# Patient Record
Sex: Female | Born: 1950
Health system: Southern US, Community
[De-identification: ages and names within clinical notes are randomized; demographics above are authoritative.]

## PROBLEM LIST (undated history)

## (undated) DIAGNOSIS — M1990 Unspecified osteoarthritis, unspecified site: Secondary | ICD-10-CM

## (undated) DIAGNOSIS — F32A Depression, unspecified: Secondary | ICD-10-CM

## (undated) DIAGNOSIS — I10 Essential (primary) hypertension: Secondary | ICD-10-CM

## (undated) DIAGNOSIS — T8859XA Other complications of anesthesia, initial encounter: Secondary | ICD-10-CM

## (undated) DIAGNOSIS — K219 Gastro-esophageal reflux disease without esophagitis: Secondary | ICD-10-CM

## (undated) DIAGNOSIS — F329 Major depressive disorder, single episode, unspecified: Secondary | ICD-10-CM

## (undated) DIAGNOSIS — T4145XA Adverse effect of unspecified anesthetic, initial encounter: Secondary | ICD-10-CM

## (undated) DIAGNOSIS — F419 Anxiety disorder, unspecified: Secondary | ICD-10-CM

## (undated) DIAGNOSIS — D241 Benign neoplasm of right breast: Secondary | ICD-10-CM

## (undated) HISTORY — PX: CHOLECYSTECTOMY: SHX55

## (undated) HISTORY — PX: ABDOMINAL HYSTERECTOMY: SHX81

## (undated) HISTORY — PX: APPENDECTOMY: SHX54

---

## 2000-01-07 ENCOUNTER — Encounter: Admission: RE | Admit: 2000-01-07 | Discharge: 2000-02-03 | Payer: Self-pay | Admitting: Neurological Surgery

## 2003-10-15 ENCOUNTER — Ambulatory Visit (HOSPITAL_COMMUNITY): Admission: RE | Admit: 2003-10-15 | Discharge: 2003-10-15 | Payer: Self-pay | Admitting: Cardiology

## 2004-06-01 ENCOUNTER — Ambulatory Visit (HOSPITAL_COMMUNITY): Admission: RE | Admit: 2004-06-01 | Discharge: 2004-06-01 | Payer: Self-pay | Admitting: Obstetrics and Gynecology

## 2004-06-01 ENCOUNTER — Encounter (INDEPENDENT_AMBULATORY_CARE_PROVIDER_SITE_OTHER): Payer: Self-pay | Admitting: Specialist

## 2004-12-18 ENCOUNTER — Encounter (INDEPENDENT_AMBULATORY_CARE_PROVIDER_SITE_OTHER): Payer: Self-pay | Admitting: Specialist

## 2004-12-18 ENCOUNTER — Inpatient Hospital Stay (HOSPITAL_COMMUNITY): Admission: EM | Admit: 2004-12-18 | Discharge: 2004-12-19 | Payer: Self-pay | Admitting: Emergency Medicine

## 2006-02-08 ENCOUNTER — Ambulatory Visit: Payer: Self-pay | Admitting: Family Medicine

## 2007-10-30 ENCOUNTER — Emergency Department (HOSPITAL_COMMUNITY): Admission: EM | Admit: 2007-10-30 | Discharge: 2007-10-30 | Payer: Self-pay | Admitting: Emergency Medicine

## 2007-11-01 ENCOUNTER — Emergency Department (HOSPITAL_COMMUNITY): Admission: EM | Admit: 2007-11-01 | Discharge: 2007-11-01 | Payer: Self-pay | Admitting: Emergency Medicine

## 2009-02-25 ENCOUNTER — Ambulatory Visit: Payer: Self-pay | Admitting: Family Medicine

## 2009-02-25 ENCOUNTER — Observation Stay (HOSPITAL_COMMUNITY): Admission: EM | Admit: 2009-02-25 | Discharge: 2009-02-27 | Payer: Self-pay | Admitting: Emergency Medicine

## 2009-10-20 ENCOUNTER — Ambulatory Visit: Payer: Self-pay | Admitting: Gastroenterology

## 2010-02-16 ENCOUNTER — Ambulatory Visit: Payer: Self-pay | Admitting: Surgery

## 2010-03-31 ENCOUNTER — Ambulatory Visit: Payer: Self-pay | Admitting: Surgery

## 2010-04-03 LAB — PATHOLOGY REPORT

## 2010-08-13 LAB — POCT I-STAT, CHEM 8
BUN: 16 mg/dL (ref 6–23)
Calcium, Ion: 1.06 mmol/L — ABNORMAL LOW (ref 1.12–1.32)
Chloride: 106 mEq/L (ref 96–112)
Glucose, Bld: 102 mg/dL — ABNORMAL HIGH (ref 70–99)
Potassium: 3.8 mEq/L (ref 3.5–5.1)
Sodium: 139 mEq/L (ref 135–145)
TCO2: 22 mmol/L (ref 0–100)

## 2010-08-13 LAB — CK TOTAL AND CKMB (NOT AT ARMC): Relative Index: INVALID (ref 0.0–2.5)

## 2010-08-13 LAB — CARDIAC PANEL(CRET KIN+CKTOT+MB+TROPI)
CK, MB: 0.8 ng/mL (ref 0.3–4.0)
Relative Index: INVALID (ref 0.0–2.5)
Total CK: 43 U/L (ref 7–177)
Troponin I: 0.03 ng/mL (ref 0.00–0.06)
Troponin I: 0.06 ng/mL (ref 0.00–0.06)

## 2010-08-13 LAB — CBC
HCT: 34.9 % — ABNORMAL LOW (ref 36.0–46.0)
Hemoglobin: 12.1 g/dL (ref 12.0–15.0)
MCHC: 34.8 g/dL (ref 30.0–36.0)
RBC: 3.83 MIL/uL — ABNORMAL LOW (ref 3.87–5.11)

## 2010-08-13 LAB — LIPID PANEL
Cholesterol: 247 mg/dL — ABNORMAL HIGH (ref 0–200)
Total CHOL/HDL Ratio: 4.1 RATIO
Triglycerides: 565 mg/dL — ABNORMAL HIGH (ref <150)
VLDL: UNDETERMINED mg/dL (ref 0–40)

## 2010-08-13 LAB — BASIC METABOLIC PANEL
Creatinine, Ser: 0.99 mg/dL (ref 0.4–1.2)
GFR calc non Af Amer: 57 mL/min — ABNORMAL LOW (ref >60)
Potassium: 3.8 mEq/L (ref 3.5–5.1)

## 2010-08-13 LAB — POCT CARDIAC MARKERS: Myoglobin, poc: 42.3 ng/mL (ref 12–200)

## 2010-09-25 NOTE — Op Note (Signed)
[name], [name]                   ACCOUNT NO.:  [id number]   MEDICAL RECORD NO.:  [id number]          PATIENT TYPE:  AMB   LOCATION:  SDC                           FACILITY:  [facility]   PHYSICIAN:  Michelle L. Grewal, M.D.DATE OF BIRTH:  1951-04-26   DATE OF PROCEDURE:  06/01/2004  DATE OF DISCHARGE:                                 OPERATIVE REPORT   PREOPERATIVE DIAGNOSIS:  Postmenopausal bleeding and endometrial polyp.   POSTOPERATIVE DIAGNOSIS:  Postmenopausal bleeding and endometrial polyp.   PROCEDURE:  Dilatation and curettage and hysteroscopy.   ANESTHESIA:  MAC with local.   SPECIMENS:  Endometrial polyp.   ESTIMATED BLOOD LOSS:  50 cubic centimeters.   COMPLICATIONS:  None.   DESCRIPTION OF PROCEDURE:  The patient was taken to the operating room.  She  was given sedation.  She was placed in the lithotomy position and prepped  and draped in the usual sterile fashion.  Speculum was placed in the vagina.  The cervix was grasped with a tenaculum and a paracervical block was  performed in the standard fashion.  The uterus was sounded to 8 cm and noted  to be in the midline.  Cervical internal os was gently dilated using Pratt  dilators.  The diagnostic hysteroscope was inserted into the uterine cavity  and, with good visualization, a small endometrial polyp was visualized  coming from the left wall of the uterus.  Otherwise, the endometrium  appeared very atrophic.  The hysteroscope was removed and a sharp curet was  removed, and a thorough uterine curettage was performed and, using polyp  forceps, the endometrial polyp was retrieved.  A final sharp curettage was  performed.  All tissue was sent to pathology.  There was no bleeding noted  at the end of the procedure.  The patient tolerated the procedure well and  all sponge, lap, and instrument counts were correct x 2.      MLG/MEDQ  D:  06/01/2004  T:  06/01/2004  Job:  [id number]

## 2010-09-25 NOTE — Cardiovascular Report (Signed)
NAMESHAVONN, CONVEY                             ACCOUNT NO.:  [id number]   MEDICAL RECORD NO.:  [id number]                   PATIENT TYPE:  OIB   LOCATION:  2899                                 FACILITY:  [facility]   PHYSICIAN:  Aram Candela. Tysinger, M.D.              DATE OF BIRTH:  [birth year]   DATE OF PROCEDURE:  10/15/2003  DATE OF DISCHARGE:  10/15/2003                              CARDIAC CATHETERIZATION   PROCEDURES:  1. Left heart catheterization.  2. Coronary cineangiography.  3. Left ventricular cineangiography.  4. Angio-Seal to the right femoral artery.   INDICATION FOR PROCEDURES:  This 60 year old female had the recent onset of  anterior chest pain and stress test done on May 5 was positive with  significant ST segment depression with evidence for myocardial ischemia and  a strong family history of ischemic heart disease.  She was scheduled for  delineation of her cardiac status.   PROCEDURE:  After signing an informed consent, the patient was premedicated  with 50 mg of Benadryl intravenously and brought to the cardiac  catheterization lab.  Her right groin was prepped and draped in sterile  fashion and anesthetized locally with 1% lidocaine.  A 6 French introducer  sheath was inserted percutaneously into the right femoral artery.  6 [place]  #4 Judkins coronary catheters were used to make injections into the native  coronary arteries.  A 6 French pigtail catheter was used to measures  pressures in the left ventricle and aorta and to make midstream injection  into the left ventricle.  The patient tolerated the procedure well and no  complications were noted.  At the end of the procedure the catheter and  sheath were removed from the right femoral artery and hemostasis was easily  obtained with an Angio-Seal closure system.   MEDICATIONS GIVEN:  None.   CINE FINDINGS:   CORONARY CINEANGIOGRAPHY:  1. Left coronary artery:  The ostium and left main appear normal.  2. Left  anterior descending appears normal.  3. Circumflex coronary artery appears normal.  4. Right coronary artery appears normal.   LEFT VENTRICULAR CINEANGIOGRAM:  The left ventricular chamber size and  contractility appear normal.  There are no abnormally moving segments.  The  ejection fraction is estimated at 60-70%.  The aortic and mitral valves  appear normal.  There is no evidence for mitral insufficiency, calcification  or prolapse.   FINAL DIAGNOSES:  1. Normal cardiac study.  2. Normal coronary arteries.  3. Normal left ventricular function.  4. Normal aortic and mitral valves.  5. Successful Perclose of the right femoral artery.   DISPOSITION:  Will monitor on short stay unit prior to discharge later  today.  Will follow up in the office in two to three weeks.  John R. Aleen Campi, M.D.    JRT/MEDQ  D:  10/15/2003  T:  10/16/2003  Job:  [id number]

## 2010-09-25 NOTE — H&P (Signed)
[name], [name]                   ACCOUNT NO.:  [id number]   MEDICAL RECORD NO.:  [id number]          PATIENT TYPE:  INP   LOCATION:  0101                         FACILITY:  [facility]   PHYSICIAN:  Anselm Pancoast. Weatherly, M.D.DATE OF BIRTH:  04/30/1951   DATE OF ADMISSION:  12/18/2004  DATE OF DISCHARGE:                                HISTORY & PHYSICAL   CHIEF COMPLAINT:  Abdominal pain 48 hours.   HISTORY:  [name] is a 60 year old female who was referred to [place] by Dr.  Lacie Scotts of the [facility] who has seen Ms. Wickliff yesterday. She  had complained of about 24 hours of right sided abdominal pain that was  increasing and first an ultrasound of the gallbladder was obtained and this  showed no stones. The patient then had a CT with oral and IV contrast that  showed a retrocecal acutely inflamed appendix and she was referred in. The  patient stated she had had no previous episodes of similar pain. She had had  a uterine polyp removed by Dr. Vincente Poli earlier this year and otherwise she is  in good health. The patient is not on any chronic blood pressure or other  chronic medications. Her white count had been 7900 yesterday but she was  definitely tender in the right lower quadrant. I repeated it in the  emergency room today and it was still only about 9200. Previously the  patient has had a GYN procedure through a midline incision I think was a  tubal ligation.   ALLERGIES:  She says that ERYTHROMYCIN causes extreme nausea and vomiting.   MEDICATIONS:  She is on estrogen replacement _________ mg daily. She is on  Prevacid and a coated aspirin daily. Trazodone 1-1/2 tablets hour of sleep  and methylprednisolone but I am not sure what she is on this for.  I think  it is anxiety.   SOCIAL HISTORY:  No history of alcohol and no history of smoking.   She had a chest x-ray in January and she __________.   PHYSICAL EXAMINATION:  GENERAL:  She is a slightly overweight Caucasian  female in no acute distress but definitely tender in the abdomen.  VITAL SIGNS:  Temperature is 97.5, blood pressure is 135/92, pulse 89,  respirations 20.  HEENT:  Negative.  LUNGS:  Clear.  CARDIAC:  Normal sinus rhythm.  ABDOMEN:  Nontender upper abdomen. In the right lower quadrant she is  definitely tender with muscle guarding. I do not appreciate any tenderness  in the left lower quadrant.  EXTREMITIES:  Lower extremities are unremarkable, good peripheral pulses and  negative psoas and obturators.  CNS:  Physiologic.   The CT that she brought in with her from the [facility] clinic showed an  acutely inflamed appendix that appears to be in the retrocecal area. The  patient will be given 3 g of Unasyn and plan for urgent appendectomy.       WJW/MEDQ  D:  12/18/2004  T:  12/18/2004  Job:  [id number]

## 2010-09-25 NOTE — Op Note (Signed)
[name], [name]                   ACCOUNT NO.:  [id number]   MEDICAL RECORD NO.:  [id number]          PATIENT TYPE:  INP   LOCATION:  0101                         FACILITY:  [facility]   PHYSICIAN:  Anselm Pancoast. Weatherly, M.D.DATE OF BIRTH:  [birth year]   DATE OF PROCEDURE:  12/18/2004  DATE OF DISCHARGE:                                 OPERATIVE REPORT   PREOPERATIVE DIAGNOSIS:  Acute appendicitis.   POSTOPERATIVE DIAGNOSIS:  Acute appendicitis.   OPERATION:  Appendectomy.   ANESTHESIA:  General.   SURGEON:  Anselm Pancoast. Zachery Dakins, M.D.   ASSISTANT:  Nurse.   HISTORY:  [name] is a 60 year old female who has had a 48-hour history of  progressive pain.  When her ultrasound was first done per order by Dr.  Lacie Scotts, which was normal.  Then a CT of the abdomen and pelvis was  performed this morning at [facility] that showed an inflamed  appendix in the retrocecal area.  She was referred in.  I repeated a white  count.  It was still only 9200, and she was definitely tender in the right  lower quadrant at the anterior of the iliac crest area.  She was given 3 gm  of Unasyn and I discussed with her about performing an open appendectomy.   The patient was taken to the operating suite.  Induction of general  anesthesia.  Her abdomen was prepped with Betadine surgical scrub and  solution and draped in a sterile manner.  A small incision, McBurney, was  made.  Sharp dissection in the skin and subcutaneous tissue to Scarpa's  fascia. The external oblique was opened in the direction of its fibers, and  the internal oblique was split in directions of its fibers.  The  transversalis was carefully opened, and then the peritoneum was carefully  opened into the peritoneal cavity.  You could feel the markedly inflamed  appendix kind of retrocecal and was able to kind of mobilize the cecum  medially so I could produce the actual peritoneum, so this could be  __________ to kind of flip  the appendix up.  The appendiceal mesentery was  cross-clamped with Kelly's and right angles.  There was a little bleeding.  We were able to get the vessel sutured with 3-0 silk and then divided the  vessels between clamps and ligated with 2-0 Vicryl.  The appendix was  markedly inflamed on the distal half and it was freed up to its base at the  junction of the cecum and then crushed with a hemostat and tied with a 2-0  Vicryl. A purse-string suture of 3-0 silk was placed.  The appendix removed.  The stump inverted.  A couple of Lembert sutures were placed to kind of  reinforce the purse-string.  There was good hemostasis.  We irrigated,  aspirated, watched the area where there had been a little bleeding, none.  Then the cecum is coursing in its normal position.  The peritoneum and  transversalis was closed with a running 2-0  Vicryl, and then the internal  oblique with interrupted 2-0  Vicryl.  External oblique was closed with a  running 2-0 Vicryl, and the Scarpa's fascia  closed with 4-0 Vicryl, 4-0 subcuticular suture, and Benzoin and Steri-  Strips on the skin.  The patient tolerated the procedure nicely.  Sent to  the recovery room in a stable postoperative condition.  She should be able  to start a diet this evening.  Hopefully will be ready for discharge in the  a.m.       WJW/MEDQ  D:  12/18/2004  T:  12/18/2004  Job:  [id number]   cc:   Evelene Croon  [address]  [place]  [place] [address]  Fax: [phone]

## 2011-01-08 ENCOUNTER — Inpatient Hospital Stay (HOSPITAL_COMMUNITY)
Admission: EM | Admit: 2011-01-08 | Discharge: 2011-01-15 | DRG: 871 | Disposition: A | Discharge status: Home Health | Attending: Internal Medicine | Admitting: Internal Medicine

## 2011-01-08 ENCOUNTER — Other Ambulatory Visit: Payer: Self-pay | Admitting: Internal Medicine

## 2011-01-08 ENCOUNTER — Emergency Department (HOSPITAL_COMMUNITY)

## 2011-01-08 ENCOUNTER — Inpatient Hospital Stay (HOSPITAL_COMMUNITY)

## 2011-01-08 DIAGNOSIS — N2589 Other disorders resulting from impaired renal tubular function: Secondary | ICD-10-CM | POA: Diagnosis present

## 2011-01-08 DIAGNOSIS — E876 Hypokalemia: Secondary | ICD-10-CM | POA: Diagnosis present

## 2011-01-08 DIAGNOSIS — N170 Acute kidney failure with tubular necrosis: Secondary | ICD-10-CM | POA: Diagnosis present

## 2011-01-08 DIAGNOSIS — Z79899 Other long term (current) drug therapy: Secondary | ICD-10-CM

## 2011-01-08 DIAGNOSIS — D696 Thrombocytopenia, unspecified: Secondary | ICD-10-CM | POA: Diagnosis present

## 2011-01-08 DIAGNOSIS — E785 Hyperlipidemia, unspecified: Secondary | ICD-10-CM | POA: Diagnosis present

## 2011-01-08 DIAGNOSIS — Z888 Allergy status to other drugs, medicaments and biological substances status: Secondary | ICD-10-CM

## 2011-01-08 DIAGNOSIS — G9341 Metabolic encephalopathy: Secondary | ICD-10-CM | POA: Diagnosis present

## 2011-01-08 DIAGNOSIS — Z803 Family history of malignant neoplasm of breast: Secondary | ICD-10-CM

## 2011-01-08 DIAGNOSIS — L02419 Cutaneous abscess of limb, unspecified: Secondary | ICD-10-CM | POA: Diagnosis present

## 2011-01-08 DIAGNOSIS — J811 Chronic pulmonary edema: Secondary | ICD-10-CM | POA: Diagnosis not present

## 2011-01-08 DIAGNOSIS — I4891 Unspecified atrial fibrillation: Secondary | ICD-10-CM | POA: Diagnosis not present

## 2011-01-08 DIAGNOSIS — I10 Essential (primary) hypertension: Secondary | ICD-10-CM | POA: Diagnosis present

## 2011-01-08 DIAGNOSIS — Z8249 Family history of ischemic heart disease and other diseases of the circulatory system: Secondary | ICD-10-CM

## 2011-01-08 DIAGNOSIS — F341 Dysthymic disorder: Secondary | ICD-10-CM | POA: Diagnosis present

## 2011-01-08 DIAGNOSIS — Z7982 Long term (current) use of aspirin: Secondary | ICD-10-CM

## 2011-01-08 DIAGNOSIS — N898 Other specified noninflammatory disorders of vagina: Secondary | ICD-10-CM | POA: Diagnosis present

## 2011-01-08 DIAGNOSIS — E871 Hypo-osmolality and hyponatremia: Secondary | ICD-10-CM | POA: Diagnosis present

## 2011-01-08 DIAGNOSIS — M51379 Other intervertebral disc degeneration, lumbosacral region without mention of lumbar back pain or lower extremity pain: Secondary | ICD-10-CM | POA: Diagnosis present

## 2011-01-08 DIAGNOSIS — Z881 Allergy status to other antibiotic agents status: Secondary | ICD-10-CM

## 2011-01-08 DIAGNOSIS — M5137 Other intervertebral disc degeneration, lumbosacral region: Secondary | ICD-10-CM | POA: Diagnosis present

## 2011-01-08 DIAGNOSIS — A419 Sepsis, unspecified organism: Secondary | ICD-10-CM | POA: Diagnosis present

## 2011-01-08 DIAGNOSIS — K219 Gastro-esophageal reflux disease without esophagitis: Secondary | ICD-10-CM | POA: Diagnosis present

## 2011-01-08 LAB — DIFFERENTIAL
Basophils Absolute: 0 10*3/uL (ref 0.0–0.1)
Basophils Absolute: 0 10*3/uL (ref 0.0–0.1)
Basophils Relative: 0 % (ref 0–1)
Lymphocytes Relative: 3 % — ABNORMAL LOW (ref 12–46)
Lymphs Abs: 0.3 10*3/uL — ABNORMAL LOW (ref 0.7–4.0)
Monocytes Relative: 2 % — ABNORMAL LOW (ref 3–12)
Monocytes Relative: 2 % — ABNORMAL LOW (ref 3–12)
Neutro Abs: 9.7 10*3/uL — ABNORMAL HIGH (ref 1.7–7.7)
Neutrophils Relative %: 93 % — ABNORMAL HIGH (ref 43–77)

## 2011-01-08 LAB — POCT I-STAT, CHEM 8
BUN: 57 mg/dL — ABNORMAL HIGH (ref 6–23)
Chloride: 103 mEq/L (ref 96–112)
Creatinine, Ser: 5.3 mg/dL — ABNORMAL HIGH (ref 0.50–1.10)
HCT: 38 % (ref 36.0–46.0)
Hemoglobin: 12.9 g/dL (ref 12.0–15.0)
Potassium: 3.4 mEq/L — ABNORMAL LOW (ref 3.5–5.1)
Sodium: 128 mEq/L — ABNORMAL LOW (ref 135–145)

## 2011-01-08 LAB — URINE MICROSCOPIC-ADD ON

## 2011-01-08 LAB — RAPID URINE DRUG SCREEN, HOSP PERFORMED
Amphetamines: POSITIVE — AB
Barbiturates: NOT DETECTED
Benzodiazepines: NOT DETECTED
Opiates: NOT DETECTED
Tetrahydrocannabinol: NOT DETECTED

## 2011-01-08 LAB — CBC
HCT: 35.5 % — ABNORMAL LOW (ref 36.0–46.0)
Hemoglobin: 12.3 g/dL (ref 12.0–15.0)
MCH: 30.4 pg (ref 26.0–34.0)
MCH: 30.4 pg (ref 26.0–34.0)
Platelets: 130 10*3/uL — ABNORMAL LOW (ref 150–400)
Platelets: 139 10*3/uL — ABNORMAL LOW (ref 150–400)
RBC: 3.72 MIL/uL — ABNORMAL LOW (ref 3.87–5.11)
RDW: 13 % (ref 11.5–15.5)
RDW: 13 % (ref 11.5–15.5)

## 2011-01-08 LAB — URINALYSIS, ROUTINE W REFLEX MICROSCOPIC: Specific Gravity, Urine: 1.025 (ref 1.005–1.030)

## 2011-01-08 LAB — CK TOTAL AND CKMB (NOT AT ARMC)
CK, MB: 12.7 ng/mL (ref 0.3–4.0)
Relative Index: 3.9 — ABNORMAL HIGH (ref 0.0–2.5)

## 2011-01-08 LAB — COMPREHENSIVE METABOLIC PANEL
Albumin: 2.1 g/dL — ABNORMAL LOW (ref 3.5–5.2)
Alkaline Phosphatase: 99 U/L (ref 39–117)
BUN: 60 mg/dL — ABNORMAL HIGH (ref 6–23)
Calcium: 7.1 mg/dL — ABNORMAL LOW (ref 8.4–10.5)
Creatinine, Ser: 4.13 mg/dL — ABNORMAL HIGH (ref 0.50–1.10)
Potassium: 3.7 mEq/L (ref 3.5–5.1)
Total Protein: 5.7 g/dL — ABNORMAL LOW (ref 6.0–8.3)

## 2011-01-08 LAB — MRSA PCR SCREENING: MRSA by PCR: NEGATIVE

## 2011-01-08 LAB — MAGNESIUM: Magnesium: 1.7 mg/dL (ref 1.5–2.5)

## 2011-01-08 LAB — LACTIC ACID, PLASMA
Lactic Acid, Venous: 2.1 mmol/L (ref 0.5–2.2)
Lactic Acid, Venous: 1.5 mmol/L (ref 0.5–2.2)

## 2011-01-08 LAB — PROCALCITONIN
Procalcitonin: 67.08 ng/mL
Procalcitonin: 45.93 ng/mL

## 2011-01-08 LAB — HEPATIC FUNCTION PANEL
ALT: 19 U/L (ref 0–35)
Alkaline Phosphatase: 109 U/L (ref 39–117)
Bilirubin, Direct: 0.8 mg/dL — ABNORMAL HIGH (ref 0.0–0.3)

## 2011-01-08 LAB — TECHNOLOGIST SMEAR REVIEW

## 2011-01-08 LAB — TROPONIN I: Troponin I: <0.3 ng/mL (ref <0.30)

## 2011-01-08 LAB — CREATININE, URINE, RANDOM: Creatinine, Urine: 92.75 mg/dL

## 2011-01-09 ENCOUNTER — Inpatient Hospital Stay (HOSPITAL_COMMUNITY)

## 2011-01-09 DIAGNOSIS — R112 Nausea with vomiting, unspecified: Secondary | ICD-10-CM

## 2011-01-09 DIAGNOSIS — R197 Diarrhea, unspecified: Secondary | ICD-10-CM

## 2011-01-09 DIAGNOSIS — L03119 Cellulitis of unspecified part of limb: Secondary | ICD-10-CM

## 2011-01-09 DIAGNOSIS — L02419 Cutaneous abscess of limb, unspecified: Secondary | ICD-10-CM

## 2011-01-09 LAB — COMPREHENSIVE METABOLIC PANEL
ALT: 18 U/L (ref 0–35)
CO2: 12 mEq/L — ABNORMAL LOW (ref 19–32)
Calcium: 6.3 mg/dL — CL (ref 8.4–10.5)
Creatinine, Ser: 4.65 mg/dL — ABNORMAL HIGH (ref 0.50–1.10)
GFR calc Af Amer: 12 mL/min — ABNORMAL LOW (ref >60)
GFR calc non Af Amer: 10 mL/min — ABNORMAL LOW (ref >60)
Glucose, Bld: 90 mg/dL (ref 70–99)
Sodium: 132 mEq/L — ABNORMAL LOW (ref 135–145)
Total Protein: 5.4 g/dL — ABNORMAL LOW (ref 6.0–8.3)

## 2011-01-09 LAB — CBC
HCT: 30.8 % — ABNORMAL LOW (ref 36.0–46.0)
Hemoglobin: 10.7 g/dL — ABNORMAL LOW (ref 12.0–15.0)
MCH: 30.6 pg (ref 26.0–34.0)
MCHC: 34.7 g/dL (ref 30.0–36.0)
RDW: 13.4 % (ref 11.5–15.5)

## 2011-01-09 LAB — DIFFERENTIAL
Basophils Absolute: 0 10*3/uL (ref 0.0–0.1)
Basophils Relative: 0 % (ref 0–1)
Eosinophils Relative: 4 % (ref 0–5)
Monocytes Absolute: 0.4 10*3/uL (ref 0.1–1.0)
Monocytes Relative: 5 % (ref 3–12)

## 2011-01-09 LAB — CARDIAC PANEL(CRET KIN+CKTOT+MB+TROPI)
CK, MB: 8.6 ng/mL (ref 0.3–4.0)
Total CK: 162 U/L (ref 7–177)
Troponin I: <0.3 ng/mL (ref <0.30)

## 2011-01-09 LAB — URINE CULTURE: Colony Count: >=100000

## 2011-01-09 LAB — PROCALCITONIN: Procalcitonin: 25.38 ng/mL

## 2011-01-09 LAB — HEPARIN LEVEL (UNFRACTIONATED): Heparin Unfractionated: <0.1 IU/mL — ABNORMAL LOW (ref 0.30–0.70)

## 2011-01-09 NOTE — Consult Note (Signed)
[name] NO.:  [id number]  MEDICAL RECORD NO.:  [id number]  LOCATION:  2601                         FACILITY:  [facility]  PHYSICIAN:  Maree Krabbe, M.D.DATE OF BIRTH:  Nov 30, 1950  DATE OF CONSULTATION:  01/09/2011 DATE OF DISCHARGE:                                CONSULTATION   REQUESTING PHYSICIAN:  Hollice Espy, MD  HISTORY:  This is a 60 year old female with a history of hypertension, hyperlipidemia, and depression who was admitted with a 4-day history of progressive redness and pain in her left thigh.  She had been at a ball game 4 days prior to admission and was felt to possibly have an insect bite that started the infection.  She then developed profuse nausea,  vomiting, and diarrhea and was admitted on August 31.  In the emergency room, her creatinine was found to be 5.3.  She was diagnosed with a left thigh cellulitis.  A CT scan showed some superficial fluid collections but no abscess formation.  She has been treated with vancomycin and Zosyn, and clindamycin was added for possible strep toxin effects.  Creatinine decreased to 4.1 and then increased back up to 4.6 today and the Renal Service was asked to see the patient for acute renal failure.  The patient is confused, cannot provide any history.  Family says that there is no history of any kidney disease in the patient or in their family.  She is a nonsmoker, nondrinker.  She takes a beta-blocker for high blood pressure.  She takes depression medication.  They live in [place].  She is married.  She worked as a housewife.  PAST MEDICAL HISTORY:  As above.  HOME MEDICATIONS: 1. Sertraline 100, 2 daily. 2. Trazodone 150, 2 at night. 3. Fish oil 1 a day. 4. Metoprolol tartrate 50 b.i.d. 5. Cyclobenzaprine 10 t.i.d. p.r.n. 6. Premarin 1.25 mg daily. 7. Aspirin 325, 1 daily. 8. Medroxyprogesterone acetate 5 mg 1 daily. 9. Protonix 40 q.a.m.  PAST SURGICAL HISTORY:   Appendectomy, cholecystectomy, tubal ligation.  ALLERGIES:  ERYTHROMYCIN and VALIUM.  SOCIAL HISTORY:  As above.  FAMILY HISTORY:  As above.  REVIEW OF SYSTEMS:  Family denies any recent complaints of shortness of breath or chest pain, visual change, sore throat, difficulty swallowing, difficulty voiding or dysuria, bloody urine or tea-colored urine.  They deny any recent history of ankle swelling, joint pain or swelling, focal numbness, or weakness.  PHYSICAL EXAMINATION:  VITAL SIGNS:  Blood pressure was in the 90s on admission, currently is up to 110/70, heart rate is AFib which is new, ventricular rate of 116, temperature 98.6, respirations 20 nonlabored, O2 sat is 98% on 2 liters. GENERAL:  The patient appears to be well-developed, well-nourished, not in any acute distress.  She keeps her eyes closed and is acting with some degree of giddiness.  She is aware that people are in the room and is verbalizing efficiently but is quite confused. SKIN:  Warm and dry without rash, cyanosis, or edema.  No petechiae or purpura. HEENT:  PERRLA, EOMI.  Throat is clear and moist. NECK:  Supple without JVD. CHEST:  Clear throughout.  No  rales, rhonchi, or wheezing. CARDIAC:  Regular rate and irregular rhythm without murmur, rub, or gallop. ABDOMEN:  Soft, nontender, active bowel sounds.  No masses or ascites. GU:  Foley catheter in place, draining mostly clear light yellow urine. EXTREMITIES:  There is an area of cellulitis in the left medial thigh with some blisters which have unroofed and according to the markings it is improving.  There is no joint effusion or deformity.  Good range of motion of all the joints.  No ankle swelling.  No cyanosis in the feet. NEUROLOGIC:  No focal deficits grossly.  Labs from today, sodium 132, potassium 3.3, bicarb 12, BUN 60, creatinine 4.65, albumin 1.8, calcium 6.3, adjusted to 7.8.  Liver enzymes are normal.  Hemoglobin is 10.7, white blood count  9000, platelets 137,000 slightly low at 130 on admission.  Urine sediment today shows sheets of granular casts.  There were no red blood cells, white blood cells, and there were no cellular casts.  Examination of the peripheral smear which was done which shows no schistocyte formation.  IMPRESSION: 1. Acute kidney injury due to acute tubular necrosis probably related     to hypovolemia and poor renal perfusion in the setting of significant     skin and soft tissue infection.  Renal status appears to be improving with     gross clearing of the urine.  She is also prerenal by FeNa calculation and      is getting IV fluids appropriately. 2. Metabolic acidosis due to acute renal failure and diarrhea.  We     will change fluids to sodium bicarb. 3. Cellulitis of the left thigh, improving clinically. 4. Altered mental status probably combination of borderline uremia and     infectious process. 5. New-onset atrial fibrillation.  RECOMMENDATIONS:  Decrease fluid slightly and change to sodium bicarbonate.  Continue checking daily creatinine monitoring urine output closely.  Do not feel there is a strong indication for dialysis at this time.  If her mental status does not improve or if her renal function gets significantly worse, she may require acute dialysis.  I have spoken with the family regarding prognosis.  The renal prognosis is good overall in this type of illness and has a high likelihood of recovery.  Medications and antibiotics have been dosed accordingly for the patient's renal insufficiency.     Maree Krabbe, M.D.    RDS/MEDQ  D:  01/09/2011  T:  01/09/2011  Job:  [id number]  Electronically Signed by Delano Metz M.D. on 01/09/2011 05:34:04 PM

## 2011-01-10 LAB — CBC
HCT: 26.6 % — ABNORMAL LOW (ref 36.0–46.0)
MCV: 85.3 fL (ref 78.0–100.0)
Platelets: 140 10*3/uL — ABNORMAL LOW (ref 150–400)
RBC: 3.12 MIL/uL — ABNORMAL LOW (ref 3.87–5.11)
RDW: 13.3 % (ref 11.5–15.5)
WBC: 8.4 10*3/uL (ref 4.0–10.5)

## 2011-01-10 LAB — WOUND CULTURE: Gram Stain: NONE SEEN

## 2011-01-10 LAB — BASIC METABOLIC PANEL
BUN: 57 mg/dL — ABNORMAL HIGH (ref 6–23)
Chloride: 99 mEq/L (ref 96–112)
GFR calc Af Amer: 11 mL/min — ABNORMAL LOW (ref >60)
GFR calc non Af Amer: 9 mL/min — ABNORMAL LOW (ref >60)
Potassium: 2.8 mEq/L — ABNORMAL LOW (ref 3.5–5.1)
Sodium: 135 mEq/L (ref 135–145)

## 2011-01-10 LAB — HEPARIN LEVEL (UNFRACTIONATED): Heparin Unfractionated: 0.16 IU/mL — ABNORMAL LOW (ref 0.30–0.70)

## 2011-01-11 LAB — HEPARIN LEVEL (UNFRACTIONATED)
Heparin Unfractionated: 0.25 IU/mL — ABNORMAL LOW (ref 0.30–0.70)
Heparin Unfractionated: 0.27 IU/mL — ABNORMAL LOW (ref 0.30–0.70)
Heparin Unfractionated: 0.34 IU/mL (ref 0.30–0.70)

## 2011-01-11 LAB — BASIC METABOLIC PANEL
BUN: 54 mg/dL — ABNORMAL HIGH (ref 6–23)
Calcium: 6.8 mg/dL — ABNORMAL LOW (ref 8.4–10.5)
Calcium: 7.3 mg/dL — ABNORMAL LOW (ref 8.4–10.5)
Creatinine, Ser: 3.72 mg/dL — ABNORMAL HIGH (ref 0.50–1.10)
GFR calc Af Amer: 13 mL/min — ABNORMAL LOW (ref >60)
GFR calc Af Amer: 15 mL/min — ABNORMAL LOW (ref >60)
GFR calc non Af Amer: 11 mL/min — ABNORMAL LOW (ref >60)
GFR calc non Af Amer: 12 mL/min — ABNORMAL LOW (ref >60)
Glucose, Bld: 139 mg/dL — ABNORMAL HIGH (ref 70–99)
Potassium: 2.5 mEq/L — CL (ref 3.5–5.1)
Sodium: 134 mEq/L — ABNORMAL LOW (ref 135–145)

## 2011-01-11 LAB — PROCALCITONIN: Procalcitonin: 11.01 ng/mL

## 2011-01-11 NOTE — Consult Note (Signed)
NAMETAILEY, TOP NO.:  [id number]  MEDICAL RECORD NO.:  [id number]  LOCATION:  2601                         FACILITY:  [facility]  PHYSICIAN:  Maisie Fus A. Lonette Stevison, M.D.DATE OF BIRTH:  [birth year]  DATE OF CONSULTATION: DATE OF DISCHARGE:                                CONSULTATION   PHYSICIAN REQUESTING CONSULTATION:  Hollice Espy, MD of the [facility].  REASON FOR CONSULTATION:  Cellulitis of left thigh.  HISTORY OF PRESENT ILLNESS:  The patient is a 60 year old female admitted on January 08, 2011, by the medical surface due to nausea, vomiting, diarrhea and cellulitis of the left inner thigh.  About 4 days ago, she developed small pimple like area according to her husband who is at the bedside.  This progressing to more redness and she was placed on Keflex a couple of days later.  She had been developed more nausea, vomiting and overall malaise and she is feeling poorly.  She was brought to the emergency room yesterday, admitted and we were asked to see her because of the cellulitis of the left inner thigh.  CT scan of her pelvis and left lower extremity were done yesterday which showed some mild edema, but no signs of abscess nor any subcutaneous air or any issue with the underlying muscular structures.  She has developed renal insufficiency and progressive systemic organ failure.  The husband does not know if she was bit by an insect while she was at a baseball game out the field who arrived at the time that this happened.  PAST MEDICAL HISTORY:  Hypertension, hyperlipidemia, anxiety, depression, gastroesophageal reflux.  PAST SURGICAL HISTORY:  Appendectomy, cholecystectomy, tubal ligation.  MEDICATIONS:  She has been on Keflex as an outpatient.  Outpatient medications include metoprolol, Protonix, trazodone, tramadol, Phenergan, valacyclovir, hydrocodone, Keflex, conjugated estrogen and medroxyprogesterone.  ALLERGIES:  ERYTHROMYCIN,  VALIUM.  SOCIAL HISTORY:  Denies tobacco or alcohol use.  FAMILY HISTORY:  Positive for breast cancer and heart disease.  REVIEW OF SYSTEMS:  The patient is quite sleepy at this point in time. According to her husband, she had no other problems except that stated above.  PHYSICAL EXAMINATION:  VITAL SIGNS:  Today, blood pressures 111/65, pulse is 114, she is now in AFib, respiratory rate is 20, temperature 97.8. GENERAL APPEARANCE:  White female somewhat sleepy, but arousable, lying in bed. HEENT:  No evidence of jaundice.  Mucous membranes are somewhat dry. NECK:  Trachea appears midline.  No obvious masses. PULMONARY:  Lung sounds are clear.  Chest wall excursion is normal. CARDIOVASCULAR:  Irregular rate, rate of about 115 on a telemonitor was Afib.  Seems to be well perfused though. ABDOMEN:  Soft, nontender. EXTREMITIES:  There is a area of left inner thigh cellulitis.  It is nonfluctuant.  There is no obvious abscess.  There is some desquamation and blistering noted with clear fluid.  This measures about 15 cm on the inner aspect of her left thigh.  The erythema does blanch.  No enlarged lymph nodes that I can feel in the left groin.  CT scan was reviewed with results as stated above.  She has a white count  of 9400, hemoglobin of 10.7 and a platelet count of 137,000. Sodium 132, potassium 3.3, chloride 104, CO2 12, BUN 60, creatinine 4.6, glucose 90.  Pro calcitonin was elevated at 25.  CK-MB is 8.6, total CK that was normal.  IMPRESSION:  Cellulitis of left inner thigh, questionable Streptococcus, questionable toxic shock syndrome less likely an insect bite or envenomation.  PLAN:  There is no need for surgical debridement at this point time, but we will keep a close watch on this.  She has no signs of any dead or necrotic tissue and does not have any signs of any necrotizing fasciitis top changes.  This all looks superficial.  Certainly, it is concerning with her  multiple organ failure and I thinks she needs to be watch closely.  If this progresses, certainly debridement will be necessary and I talked to her husband about that at greater length.  She has no underlying abscess or any subcutaneous area air in the fascia.  We will thank you this consultation.     Dominyk Law A. Doreene Forrey, M.D.     TAC/MEDQ  D:  01/09/2011  T:  01/09/2011  Job:  [id number]  Electronically Signed by Harriette Bouillon M.D. on 01/11/2011 11:00:51 AM

## 2011-01-12 LAB — RENAL FUNCTION PANEL
BUN: 49 mg/dL — ABNORMAL HIGH (ref 6–23)
CO2: 27 mEq/L (ref 19–32)
Calcium: 7.8 mg/dL — ABNORMAL LOW (ref 8.4–10.5)
Chloride: 97 mEq/L (ref 96–112)
Creatinine, Ser: 3.27 mg/dL — ABNORMAL HIGH (ref 0.50–1.10)
GFR calc non Af Amer: 14 mL/min — ABNORMAL LOW (ref >60)
Glucose, Bld: 113 mg/dL — ABNORMAL HIGH (ref 70–99)

## 2011-01-12 LAB — MAGNESIUM: Magnesium: 1.9 mg/dL (ref 1.5–2.5)

## 2011-01-12 NOTE — H&P (Signed)
NAMELEAN, JAEGER NO.:  [id number]  MEDICAL RECORD NO.:  [id number]  LOCATION:  [facility]                         FACILITY:  [facility]  PHYSICIAN:  Eduard Clos, MDDATE OF BIRTH:  January 02, 1951  DATE OF ADMISSION:  01/08/2011 DATE OF DISCHARGE:                             HISTORY & PHYSICAL   PRIMARY CARE PHYSICIAN:  Evelene Croon, MD at [facility].  CHIEF COMPLAINT:  Nausea, vomiting, diarrhea, and low blood pressure.  HISTORY OF PRESENT ILLNESS:  A 60 year old female with known history of hypertension, hyperlipidemia, GERD, depression, anxiety.  Has been experiencing nausea, vomiting, and diarrhea over the last 2-3 days.  The patient states that she had gone to a ball game on Monday, that is almost 4 days ago, and she noticed on the left thigh a small reddish spot which over the next 24 hours started growing in size involving an area almost 15 cm in diameter over the left thigh area.  She eventually started developing nausea and vomiting and it was recurrent and profuse. She went to her primary care physician who had prescribed her Keflex. After taking the Keflex, she developed diarrhea multiple times, at least 20 times, watery, wherein she did not have any control.  Eventually, she started getting dizzy and started having weakness and her family checked the blood pressure.  It was found to be low and she was brought to the ER.  In the ER, the patient was found to have a creatinine of 5.3 which was new.  The patient has been given already 2 L bolus.  She is getting the third liter bolus at this time, normal saline.  The patient will be admitted for hypotension, left thigh cellulitis, and nausea, vomiting, and diarrhea.  In addition, the patient states that over the last month and a half, she has been having some vaginal bleed.  The patient denies any chest pain, shortness of breath.  Denies any headache or visual symptoms.  Denies any focal deficit.   Denies any dysuria.  PAST MEDICAL HISTORY: 1. Hypertension. 2. Hyperlipidemia. 3. Anxiety. 4. Depression. 5. GERD.  PAST SURGICAL HISTORY: 1. Appendectomy. 2. Cholecystectomy. 3. Tubal ligation.  MEDICATIONS PRIOR TO ADMISSION: 1. Cephalexin which was just started a day ago. 2. Naprelan CR 500 mg daily. 3. Hydrocodone and acetaminophen 5/500 as needed. 4. Valacyclovir. 5. Promethazine. 6. Tramadol. 7. Medroxyprogesterone. 8. Trazodone. 9. Protonix. 10.Metoprolol. 11.Fish oil. 12.Sertraline. 13.Cyclobenzaprine. 14.Conjugated estrogen.  ALLERGIES: 1. ERYTHROMYCIN. 2. VALIUM.  SOCIAL HISTORY:  The patient is married.  Denies smoking cigarettes, drinking alcohol, or using illegal drugs.  FAMILY HISTORY:  Positive for breast cancer in her mother and coronary artery disease in her father.  REVIEW OF SYSTEMS:  As present in history of presenting illness. Nothing else significant.  PHYSICAL EXAMINATION:  GENERAL:  The patient examined at bedside, not in acute distress. VITAL SIGNS:  Blood pressure is now at 94/60, pulse 94 per minute, temperature 97.6, respiration is 18 per minute, O2 sat 98%. HEENT:  Anicteric.  No pallor.  No discharge from ears, eyes, nose or mouth. CHEST:  Bilateral air entry present.  No rhonchi.  No crepitation. HEART:  S1, S2  heard. ABDOMEN:  Soft, nontender.  Bowel sounds heard. CNS:  The patient is alert, awake, oriented to time, place, and person. Moves upper and lower extremities 5/5. EXTREMITIES:  There is swelling and redness in the left thigh area, mostly on the medial aspect.  The area involves almost 15 cm with some desquamation of the skin and induration of the skin and mild tenderness. No active discharge.  Peripherally, there are no acute ischemic changes, cyanosis, or clubbing.  Pulses were felt.  LABORATORY DATA:  CBC:  WBC is 10.4, hemoglobin is 12.9, hematocrit is 38, platelets 130.  Basic metabolic panel:  Sodium 128,  potassium 3.4, chloride 103, carbon dioxide 17, glucose 116, BUN 57, creatinine 5.3, lactic acid is 2.1.  Cultures are pending.  ASSESSMENT: 1. Systemic inflammatory response syndrome 2. Cellulitis of the left thigh. 3. Acute renal failure. 4. Thrombocytopenia. 5. Nausea, vomiting and diarrhea. 6. Vaginal bleed. 7. Sore throat. 8. Hyponatremia. 9. History of hyperlipidemia. 10.History of anxiety and depression. 11.Chronic low back pain secondary to degenerative disk disease.  PLAN: 1. At this time, we will admit the patient to step-down unit. 2. For her SIRS, at this time most likely is from the nausea,     vomiting, diarrhea with cellulitis of the left thigh and acute     renal failure. 3. For her cellulitis of the left thigh at this time, I am going to     keep the patient on vancomycin and Zosyn.  I am going to get a CT     of the left thigh and if the patient is claustrophobic, we cannot     get an MRI.  We cannot also get CT with contrast as the patient     does have renal failure.  We will look for any deep abscesses.  For     now, I am going to get blood cultures.  If possible, we will try to     get a wound culture and sensitivity. 4. Acute renal failure which is new for the patient at this time.     Most likely cause is her severe dehydration from nausea, vomiting,     and diarrhea.  In addition, the patient also is hypotensive.  We     are going to keep the patient on strict intake/output, daily     weights.  We will check urine sodium, creatinine, eosinophils.  At     this time, the patient states she does make urine.  We are going to     aggressively hydrate the patient.  If the creatinine does not     improve then the patient will need further imaging studies like     renal sonogram to make sure there is no obstruction.  I am going to     ask the nurse to place a Foley at this time to make sure that the     patient is not oliguric. 5. Nausea, vomiting, diarrhea.   I do not know the exact cause for it     at this time, probably could be related to her cellulitis.  The     patient at this time is wanting to drink something so I am going to     keep her on clear liquid diet.  If she tolerates, we will advance.     If the nausea, vomiting, and diarrhea persist, we may need further     imaging studies.  At this time I am adding LFTs  and lipase to the     labs. 6. The patient does complain of some vaginal bleed for which the     patient will need further definite workup with her primary care     physician. 7. Hyponatremia which could be from her dehydration.  We will closely     follow her BMET which I assume will improve with her hydration. 8. The patient does complain of some sore throat.  I will get a strep     throat.  At this time, there are no definite signs of any     pharyngeal erythema but we will keep a close watch on this. 9. Further recommendation based on the tests ordered and clinic     course.     Eduard Clos, MD     ANK/MEDQ  D:  01/08/2011  T:  01/08/2011  Job:  [id number]  cc:   Evelene Croon, MD  Electronically Signed by Midge Minium MD on 01/12/2011 09:48:36 AM

## 2011-01-12 NOTE — H&P (Signed)
  NAMEABAGAEL, KRAMM NO.:  [id number]  MEDICAL RECORD NO.:  [id number]  LOCATION:  [facility]                         FACILITY:  [facility]  PHYSICIAN:  Eduard Clos, MDDATE OF BIRTH:  Aug 12, 1950  DATE OF ADMISSION:  01/08/2011 DATE OF DISCHARGE:                             HISTORY & PHYSICAL   PRIMARY CARE PHYSICIAN:  Meindert A. Lacie Scotts, MD  In addition, the patient did have a chest x-ray, which was read as linear lung base opacity, likely scarring or atelectasis.  A mild edema pattern could also be considered in the appropriate clinical setting, otherwise no focal consolidation.  In addition, the patient does have thrombocytopenia, which at this time could be from her SIRS and developing sepsis like picture, but given her acute renal failure, we need to make sure the patient is not developing any TTP like picture,  for which I am going to order  peripheral smear to make sure there is no schistocyte.  We need to closely follow her platelet count.  If there is any further decrease in her platelet count, we may have to even order a DIC panel.     Eduard Clos, MD     ANK/MEDQ  D:  01/08/2011  T:  01/08/2011  Job:  [id number]  cc:   Evelene Croon, MD  Electronically Signed by Midge Minium MD on 01/12/2011 09:48:27 AM

## 2011-01-13 ENCOUNTER — Inpatient Hospital Stay (HOSPITAL_COMMUNITY)

## 2011-01-13 LAB — RENAL FUNCTION PANEL
Albumin: 2.1 g/dL — ABNORMAL LOW (ref 3.5–5.2)
BUN: 37 mg/dL — ABNORMAL HIGH (ref 6–23)
CO2: 26 mEq/L (ref 19–32)
Calcium: 9 mg/dL (ref 8.4–10.5)
Chloride: 99 mEq/L (ref 96–112)
Creatinine, Ser: 2.13 mg/dL — ABNORMAL HIGH (ref 0.50–1.10)
GFR calc Af Amer: 28 mL/min — ABNORMAL LOW (ref >60)
GFR calc non Af Amer: 23 mL/min — ABNORMAL LOW (ref >60)
Glucose, Bld: 104 mg/dL — ABNORMAL HIGH (ref 70–99)
Phosphorus: 3 mg/dL (ref 2.3–4.6)
Potassium: 3.9 mEq/L (ref 3.5–5.1)
Sodium: 137 mEq/L (ref 135–145)

## 2011-01-13 LAB — PRO B NATRIURETIC PEPTIDE: Pro B Natriuretic peptide (BNP): 27349 pg/mL — ABNORMAL HIGH (ref 0–125)

## 2011-01-13 LAB — MAGNESIUM: Magnesium: 2.1 mg/dL (ref 1.5–2.5)

## 2011-01-14 LAB — CULTURE, BLOOD (ROUTINE X 2)
Culture  Setup Time: 201208311043
Culture: NO GROWTH
Culture: NO GROWTH

## 2011-01-14 LAB — RENAL FUNCTION PANEL
Albumin: 2.1 g/dL — ABNORMAL LOW (ref 3.5–5.2)
Calcium: 9 mg/dL (ref 8.4–10.5)
Chloride: 97 mEq/L (ref 96–112)
Creatinine, Ser: 1.67 mg/dL — ABNORMAL HIGH (ref 0.50–1.10)
GFR calc Af Amer: 38 mL/min — ABNORMAL LOW (ref >60)
GFR calc non Af Amer: 31 mL/min — ABNORMAL LOW (ref >60)
Sodium: 138 mEq/L (ref 135–145)

## 2011-01-15 ENCOUNTER — Inpatient Hospital Stay (HOSPITAL_COMMUNITY)

## 2011-01-15 LAB — BASIC METABOLIC PANEL
BUN: 22 mg/dL (ref 6–23)
Calcium: 9.2 mg/dL (ref 8.4–10.5)
GFR calc non Af Amer: 37 mL/min — ABNORMAL LOW (ref >60)
Glucose, Bld: 122 mg/dL — ABNORMAL HIGH (ref 70–99)

## 2011-01-18 NOTE — Progress Notes (Signed)
NAMEANAYELY, [name] NO.:  [id number]  MEDICAL RECORD NO.:  [id number]  LOCATION:  2601                         FACILITY:  [facility]  PHYSICIAN:  Hollice Espy, M.D.DATE OF BIRTH:  Jul 24, 1950                                PROGRESS NOTE   CONSULTANTS ON THIS CASE: 1. Maree Krabbe, MD, Nephrology. 2. Clovis Pu. Cornett, MD, General Surgery.  DIAGNOSES TO DATE: 1. Left inner thigh cellulitis. 2. Acute renal failure secondary to acute tubular necrosis brought on     by volume depletion with ongoing gastrointestinal losses. 3. Hypokalemia. 4. Metabolic alkalosis brought on by bicarb drip. 5. Sepsis from left thigh cellulitis, now resolved. 6. Delirium brought on by the uremia brought on by renal failure, now     resolved.  Please note that all diagnoses with the exception of the metabolic alkalosis were all present on admission.  HOSPITAL COURSE: The patient is a 60 year old white female with past medical history of hypertension, GERD, and hyperlipidemia, and had noticed a small wound, she thought may have been a bug bite versus an ingrown hair on Monday, January 03, 2010.  However, the next several days, the wound had started to blister as well as having surrounding erythema concerning for cellulitis.  The patient saw her PCP who put her on Keflex.  The patient continued to have increased redness, increased blistering, and started having diarrhea.  She was brought in to the emergency room on January 08, 2011.  In regard to her cellulitis, she had some early signs of sloughing as well as continued blistering of the left inner thigh wound, Wound Care was consulted.  Surgery was consulted as well to ensure that this was not any type of necrotizing disease.  Surgery was concerned about the possibility of strep culture.  Wound cultures were taken; however, these were after several doses of antibiotics and cultures have had no growth to date.  In the  meantime, she has responded nicely, her white count has stayed stable.  She has spike no fevers and continues to do well.  General surgery signed off on January 11, 2011.  The plan will be to continue the patient on antibiotics at least until January 16, 2011.  In regard to her renal failure, on admission, the patient was noted to have creatinine of 5.3.  She has no previous history of renal disease and this was suspected to be some combination of prerenal plus ATN from volume depletion brought on by dehydration as well as diarrhea. The patient was aggressively hydrated.  Her creatinine improved to 4.3 as of the following day, but then started to climb back up to 4.6.  Dr. Arlean Hopping from Nephrology was consulted who agreed with the ATN, prerenal azotemia diagnosis.  The patient was put on the bicarb drip.  She responded well to this as of day of dictation on January 12, 2011, the patient's creatinine is improved to 3.27.  She was noted to have some metabolic alkalosis should be secondary to bicarb drip.  Currently, she is on IV fluids of normal saline.  In regard to the patient's hypokalemia due to her diuresis, she is currently  receiving replacement of potassium.  In regard to her delirium, the patient initially on hospital day #1 looked to be slightly confused, worsened over the course of the weekend where she became much more delirious, quite confused.  It was felt to be secondary to uremia brought on by her renal failure.  As her creatinine function improved, the patient became much more alert and as of January 12, 2011, she looks to be fully alert and oriented x3. In regard to her antibiotics, the patient has been on Cleocin and Zosyn to cover Gram-positive and negative.  She has responded well with this and again antibiotics will be need to continue for a total of 7-10 days of therapy.  In regard to her hypertension and hyperlipidemia, these have been stable during her hospital  course.  Because of her initial presentation with tachycardia, hypotension, confusion from an infection, and this was felt to be sepsis, although she received antibiotics and blood cultures were no growth to date.  Plan will be for the patient to be transferred to the floor.  She received physical therapy to assess if she needs home health PT.  She expected to make a good recovery.  Her final renal function will be determined over the next few days.  Please note that at one point, it was noted in the note that she had a questionable atrial fibrillation; however, in closure review of her EKGs, it was felt to be sinus tachycardia, an echocardiogram was done, which noted no enlarged atrium and her EKG do well described while calling an atrial fibrillation actually had P-waves and so this is more sinus tachycardia likely a multifocal atrial tachycardia.     Hollice Espy, M.D.     SKK/MEDQ  D:  01/12/2011  T:  01/12/2011  Job:  [id number]  cc:   Maree Krabbe, M.D. Fax: [phone]  Evelene Croon, MD Fax: [phone]  Electronically Signed by Virginia Rochester M.D. on 01/18/2011 04:42:24 PM

## 2011-02-04 LAB — CULTURE, ROUTINE-ABSCESS: Gram Stain: NONE SEEN

## 2011-02-22 NOTE — Discharge Summary (Signed)
NAMELENORIA, [name]:  [id number]  MEDICAL RECORD NO.:  [id number]  LOCATION:                                 FACILITY:  PHYSICIAN:  Clydia Llano, MD       DATE OF BIRTH:  05/17/1950  DATE OF ADMISSION: DATE OF DISCHARGE:                              DISCHARGE SUMMARY   PRIMARY CARE PHYSICIAN:  Emma Croon, MD at [facility].  REASON FOR ADMISSION:  Nausea and vomiting.  DISCHARGE DIAGNOSES: 1. Left lower extremity cellulitis. 2. Sepsis secondary to left lower extremity cellulitis. 3. Acute kidney injury likely acute tubular necrosis. 4. Acute metabolic encephalopathy, resolved. 5. Fluid overload with mild pulmonary edema, noncardiogenic. 6. Hypertension. 7. Hyperlipidemia. 8. Anxiety. 9. Depression. 10.Gastroesophageal reflux disease. 11.Abnormal vaginal bleeding.  DISCHARGE MEDICATIONS: 1. Augmentin 500 mg p.o. b.i.d. with meals, take for 10 more days. 2. Clindamycin 300 mg three times a day, take for 10 more days. 3. Oxycodone take 5 mg every 4 hours as needed for pain. 4. Aspirin 325 mg p.o. daily. 5. Cyclobenzaprine 10 mg p.o. three times daily as needed for pain. 6. Fish oil 100 mg p.o. daily. 7. Metoprolol tartrate 50 mg p.o. b.i.d. 8. Medroxyprogesterone acetate 5 mg p.o. daily. 9. Premarin 1.2 mg p.o. daily. 10.Protonix 40 mg p.o. daily. 11.Sertraline 100 mg two tablets daily. 12.Trazodone 150 mg two tablets daily at bedtime.  BRIEF HISTORY EXAMINATION: 1. [name] is a 60 year old Caucasian female with known history of     hypertension, dyslipidemia, gastroesophageal reflux disease.  The     patient came into the hospital with nausea, vomiting, diarrhea for     2-3 days prior to admission.  The patient stated that she had gone     to a ball game on Monday and that was 4 days prior to admission,     and she noticed at the left side, there is small reddish spot,     which is only over next 24 hours, started to  growing in size,     involving almost around the whole inner thigh of the left lower     extremity.  Eventually, the patient started developing nausea,     vomiting, recurrent and profuse.  She went to her primary care     physician, prescribed Keflex, taking the Keflex, developed diarrhea     multiple times at least, she said 20 times which is watery,     eventually the patient started to get dizzy and presented to the     emergency room.  Upon initial evaluation in the emergency     department, the patient was found to have cellulitis with some     bullae in the left inner thigh, acute renal failure with creatinine     5.3.  The patient was hypotensive with blood pressure of 94/60.     The patient admitted for further evaluation.  Left lower extremity     cellulitis complicating sepsis.  The patient did have cellulitis.     The patient admitted initially to the ICU, started on aggressive IV     fluids hydration.  The blood  pressure was holding up without need     of vasopressors.  The patient also started on broad-spectrum IV     antibiotics, which is vancomycin and Zosyn.  The patient was making     progress on that.  Blood pressure was holding up without need of IV     fluids, so the antibiotics was switched to Augmentin and     clindamycin.  The patient was doing well with progress and she was     thought safe to be discharged home.  The patient will have a nurse     for wound care at home.  The dressing should be foam dressing to     the left thigh every 3 days or p.r.n. 2. Acute renal failure.  This is likely to be ATN.  Nephrology was     consulted, Dr. Allena Katz saw the patient, recommended to continue IV     fluids as well as continue monitoring.  The patient creatinine was     taking a good turn.  The patient was presented with creatinine of     5.3, at the day of discharge it is 1.4, and every day it is going     down. 3. Fluid overload and mild pulmonary edema.  This is  noncardiogenic     mild pulmonary edema secondary to iatrogenic fluid overload from IV     fluids and inability to excrete the fluids.  The patient had some     p.r.n. orders of Lasix.  On day of discharge, the patient feeling     well and no need for oxygen.  No shortness of breath.  Please note     that on September 5, her pro BNP is 27,349.  The patient had     echocardiogram at admission showed ejection fraction of 50%-55% and     the diastolic function parameters are normal. 4. Hypertension is controlled. 5. Hypokalemia, this is resolved and after repletion. 6. Acute delirium.  This is believed to be secondary to acute illness     from the sepsis plus uremia.  This is resolved completely.  The     patient is alert, awake, oriented x3 at the time of discharge.     Clydia Llano, MD     ME/MEDQ  D:  01/15/2011  T:  01/15/2011  Job:  [id number]  cc:   Emma Croon, MD  Electronically Signed by Clydia Llano  on 02/22/2011 01:12:10 PM

## 2012-11-29 ENCOUNTER — Ambulatory Visit: Payer: Self-pay | Admitting: Obstetrics and Gynecology

## 2012-11-29 LAB — CBC
HCT: 36.7 % (ref 35.0–47.0)
HGB: 12.8 g/dL (ref 12.0–16.0)
MCH: 31.2 pg (ref 26.0–34.0)
MCV: 90 fL (ref 80–100)

## 2012-11-29 LAB — BASIC METABOLIC PANEL
EGFR (African American): 57 — ABNORMAL LOW
Glucose: 124 mg/dL — ABNORMAL HIGH (ref 65–99)
Osmolality: 276 (ref 275–301)
Sodium: 137 mmol/L (ref 136–145)

## 2012-12-07 ENCOUNTER — Ambulatory Visit: Payer: Self-pay | Admitting: Obstetrics and Gynecology

## 2012-12-08 LAB — BASIC METABOLIC PANEL
Anion Gap: 7 (ref 7–16)
BUN: 8 mg/dL (ref 7–18)
Calcium, Total: 8 mg/dL — ABNORMAL LOW (ref 8.5–10.1)
Chloride: 98 mmol/L (ref 98–107)
Co2: 26 mmol/L (ref 21–32)
Creatinine: 1.06 mg/dL (ref 0.60–1.30)
EGFR (African American): >60
EGFR (Non-African Amer.): 55 — ABNORMAL LOW
Glucose: 112 mg/dL — ABNORMAL HIGH (ref 65–99)
Osmolality: 262 (ref 275–301)
Potassium: 3.5 mmol/L (ref 3.5–5.1)
Sodium: 131 mmol/L — ABNORMAL LOW (ref 136–145)

## 2012-12-08 LAB — HEMATOCRIT: HCT: 31.7 % — ABNORMAL LOW (ref 35.0–47.0)

## 2012-12-08 LAB — CBC
MCV: 89 fL (ref 80–100)
RBC: 3.55 10*6/uL — ABNORMAL LOW (ref 3.80–5.20)
RDW: 12.4 % (ref 11.5–14.5)
WBC: 8.6 10*3/uL (ref 3.6–11.0)

## 2012-12-11 LAB — PATHOLOGY REPORT

## 2014-02-18 ENCOUNTER — Encounter: Payer: Self-pay | Admitting: Rheumatology

## 2014-03-10 ENCOUNTER — Encounter: Payer: Self-pay | Admitting: Rheumatology

## 2014-04-09 ENCOUNTER — Encounter: Payer: Self-pay | Admitting: Rheumatology

## 2014-06-30 ENCOUNTER — Emergency Department (INDEPENDENT_AMBULATORY_CARE_PROVIDER_SITE_OTHER)
Admission: EM | Admit: 2014-06-30 | Discharge: 2014-06-30 | Disposition: A | Discharge status: Home / Self Care | Payer: Medicare HMO | Source: Home / Self Care | Attending: Family Medicine | Admitting: Family Medicine

## 2014-06-30 ENCOUNTER — Encounter (HOSPITAL_COMMUNITY): Payer: Self-pay | Admitting: Emergency Medicine

## 2014-06-30 DIAGNOSIS — N390 Urinary tract infection, site not specified: Secondary | ICD-10-CM

## 2014-06-30 HISTORY — DX: Anxiety disorder, unspecified: F41.9

## 2014-06-30 HISTORY — DX: Essential (primary) hypertension: I10

## 2014-06-30 HISTORY — DX: Gastro-esophageal reflux disease without esophagitis: K21.9

## 2014-06-30 LAB — POCT URINALYSIS DIP (DEVICE)
Bilirubin Urine: NEGATIVE
GLUCOSE, UA: NEGATIVE mg/dL
KETONES UR: NEGATIVE mg/dL
Nitrite: POSITIVE — AB
PROTEIN: 30 mg/dL — AB
Specific Gravity, Urine: 1.02 (ref 1.005–1.030)
UROBILINOGEN UA: 0.2 mg/dL (ref 0.0–1.0)
pH: 5.5 (ref 5.0–8.0)

## 2014-06-30 MED ORDER — PHENAZOPYRIDINE HCL 200 MG PO TABS: 200.0000 mg | ORAL_TABLET | Freq: Three times a day (TID) | ORAL | Status: DC

## 2014-06-30 MED ORDER — CEFUROXIME AXETIL 250 MG PO TABS: 250.0000 mg | ORAL_TABLET | Freq: Two times a day (BID) | ORAL | Status: DC

## 2014-06-30 NOTE — ED Provider Notes (Signed)
CSN: [id number]     Arrival date & time 06/30/14  1715 History   First MD Initiated Contact with Patient 06/30/14 1749     Chief Complaint  Patient presents with  . Urinary Frequency  . Urinary Urgency   (Consider location/radiation/quality/duration/timing/severity/associated sxs/prior Treatment) HPI       64 year old female presents for evaluation of possible UTI. She has urinary frequency, urinary urgency, burning with urination. She also has mild lower abdominal pain and lower back pain. Symptoms have been present for 5 days, progressively worsening. She also has subjective fever. No NVD or flank pain.  Past Medical History  Diagnosis Date  . Hypertension   . Anxiety   . GERD (gastroesophageal reflux disease)    Past Surgical History  Procedure Laterality Date  . Abdominal hysterectomy    . Appendectomy    . Cholecystectomy     History reviewed. No pertinent family history. History  Substance Use Topics  . Smoking status: Never Smoker   . Smokeless tobacco: Not on file  . Alcohol Use: No   OB History    No data available     Review of Systems  Constitutional: Positive for chills. Negative for fever.  Gastrointestinal: Positive for abdominal pain. Negative for nausea, vomiting and diarrhea.  Genitourinary: Positive for dysuria, urgency and frequency. Negative for hematuria, flank pain, vaginal discharge and pelvic pain.  All other systems reviewed and are negative.   Allergies  Erythromycin and Valium  Home Medications   Prior to Admission medications   Medication Sig Start Date End Date Taking? Authorizing Provider  cyclobenzaprine (FLEXERIL) 10 MG tablet Take 10 mg by mouth at bedtime.   Yes Historical Provider, MD  metoprolol (LOPRESSOR) 50 MG tablet Take 50 mg by mouth 2 (two) times daily.   Yes Historical Provider, MD  pantoprazole (PROTONIX) 40 MG tablet Take 40 mg by mouth daily.   Yes Historical Provider, MD  sertraline (ZOLOFT) 100 MG tablet Take 200 mg  by mouth daily.   Yes Historical Provider, MD  traZODone (DESYREL) 150 MG tablet Take by mouth at bedtime.   Yes Historical Provider, MD  cefUROXime (CEFTIN) 250 MG tablet Take 1 tablet (250 mg total) by mouth 2 (two) times daily with a meal. 06/30/14   Liam Graham, PA-C  phenazopyridine (PYRIDIUM) 200 MG tablet Take 1 tablet (200 mg total) by mouth 3 (three) times daily. 06/30/14   Freeman Caldron Elex Mainwaring, PA-C   BP 142/80 mmHg  Pulse 63  Temp(Src) 99.2 F (37.3 C) (Oral)  Resp 16  SpO2 95% Physical Exam  Constitutional: She is oriented to person, place, and time. Vital signs are normal. She appears well-developed and well-nourished. No distress.  HENT:  Head: Normocephalic and atraumatic.  Pulmonary/Chest: Effort normal. No respiratory distress.  Abdominal: Soft. She exhibits no mass. There is no tenderness. There is no rebound, no guarding and no CVA tenderness.  Neurological: She is alert and oriented to person, place, and time. She has normal strength. Coordination normal.  Skin: Skin is warm and dry. No rash noted. She is not diaphoretic.  Psychiatric: She has a normal mood and affect. Judgment normal.  Nursing note and vitals reviewed.   ED Course  Procedures (including critical care time) Labs Review Labs Reviewed  POCT URINALYSIS DIP (DEVICE) - Abnormal; Notable for the following:    Hgb urine dipstick MODERATE (*)    Protein, ur 30 (*)    Nitrite POSITIVE (*)    Leukocytes, UA LARGE (*)  All other components within normal limits  URINE CULTURE    Imaging Review No results found.   MDM   1. UTI (lower urinary tract infection)    Treat with Ceftin and Pyridium. Urine culture sent. Follow-up when necessary  Discharge Medication List as of 06/30/2014  6:07 PM    START taking these medications   Details  cefUROXime (CEFTIN) 250 MG tablet Take 1 tablet (250 mg total) by mouth 2 (two) times daily with a meal., Starting 06/30/2014, Until Discontinued, Print     phenazopyridine (PYRIDIUM) 200 MG tablet Take 1 tablet (200 mg total) by mouth 3 (three) times daily., Starting 06/30/2014, Until Discontinued, [place] Emmah Bratcher, PA-C 06/30/14 [phone]

## 2014-06-30 NOTE — Discharge Instructions (Signed)
Antibiotic Medication °Antibiotics are among the most frequently prescribed medicines. Antibiotics cure illness by assisting our body to injure or kill the bacteria that cause infection. While antibiotics are useful to treat a wide variety of infections they are useless against viruses. Antibiotics cannot cure colds, flu, or other viral infections.  °There are many types of antibiotics available. Your caregiver will decide which antibiotic will be useful for an illness. Never take or give someone else's antibiotics or left over medicine. °Your caregiver may also take into account: °· Allergies. °· The cost of the medicine. °· Dosing schedules. °· Taste. °· Common side effects when choosing an antibiotic for an infection. °Ask your caregiver if you have questions about why a certain medicine was chosen. °HOME CARE INSTRUCTIONS °Read all instructions and labels on medicine bottles carefully. Some antibiotics should be taken on an empty stomach while others should be taken with food. Taking antibiotics incorrectly may reduce how well they work. Some antibiotics need to be kept in the refrigerator. Others should be kept at room temperature. Ask your caregiver or pharmacist if you do not understand how to give the medicine. °Be sure to give the amount of medicine your caregiver has prescribed. Even if you feel better and your symptoms improve, bacteria may still remain alive in the body. Taking all of the medicine will prevent: °· The infection from returning and becoming harder to treat. °· Complications from partially treated infections. °If there is any medicine left over after you have taken the medicine as your caregiver has instructed, throw the medicine away. °Be sure to tell your caregiver if you: °· Are allergic to any medicines. °· Are pregnant or intend to become pregnant while using this medicine. °· Are breastfeeding. °· Are taking any other prescription, non-prescription medicine, or herbal  remedies. °· Have any other medical conditions or problems you have not already discussed. °If you are taking birth control pills, they may not work while you are on antibiotics. To avoid unwanted pregnancy: °· Continue taking your birth control pills as usual. °· Use a second form of birth control (such as condoms) while you are taking antibiotic medicine. °· When you finish taking the antibiotic medicine, continue using the second form of birth control until you are finished with your current 1 month cycle of birth control pills. °Try not to miss any doses of medicine. If you miss a dose, take it as soon as possible. However, if it is almost time for the next dose and the dosing schedule is: °· 2 doses a day, take the missed dose and the next dose 5 to 6 hours apart. °· 3 or more doses a day, take the missed dose and the next dose 2 to 4 hours apart, then go back to the normal schedule. °· If you are unable to make up a missed dose, take the next scheduled dose on time and complete the missed dose at the end of the prescribed time for your medicine. °SIDE EFFECTS TO TAKING ANTIBIOTICS °Common side effects to antibiotic use include: °· Soft stools or diarrhea. °· Mild stomach upset. °· Sun sensitivity. °SEEK MEDICAL CARE IF:  °· If you get worse or do not improve within a few days of starting the medicine. °· Vomiting develops. °· Diaper rash or rash on the genitals appears. °· Vaginal itching occurs. °· White patches appear on the tongue or in the mouth. °· Severe watery diarrhea and abdominal cramps occur. °· Signs of an allergy develop (hives, unknown   itchy rash appears). STOP TAKING THE ANTIBIOTIC. °SEEK IMMEDIATE MEDICAL CARE IF:  °· Urine turns dark or blood colored. °· Skin turns yellow. °· Easy bruising or bleeding occurs. °· Joint pain or muscle aches occur. °· Fever returns. °· Severe headache occurs. °· Signs of an allergy develop (trouble breathing, wheezing, swelling of the lips, face or tongue,  fainting, or blisters on the skin or in the mouth). STOP TAKING THE ANTIBIOTIC. °Document Released: 01/07/2004 Document Revised: 07/19/2011 Document Reviewed: 01/16/2009 °ExitCare® Patient Information ©2015 [employer]. This information is not intended to replace advice given to you by your health care provider. Make sure you discuss any questions you have with your health care provider. ° °Urinary Tract Infection °Urinary tract infections (UTIs) can develop anywhere along your urinary tract. Your urinary tract is your body's drainage system for removing wastes and extra water. Your urinary tract includes two kidneys, two ureters, a bladder, and a urethra. Your kidneys are a pair of bean-shaped organs. Each kidney is about the size of your fist. They are located below your ribs, one on each side of your spine. °CAUSES °Infections are caused by microbes, which are microscopic organisms, including fungi, viruses, and bacteria. These organisms are so small that they can only be seen through a microscope. Bacteria are the microbes that most commonly cause UTIs. °SYMPTOMS  °Symptoms of UTIs may vary by age and gender of the patient and by the location of the infection. Symptoms in young women typically include a frequent and intense urge to urinate and a painful, burning feeling in the bladder or urethra during urination. Older women and men are more likely to be tired, shaky, and weak and have muscle aches and abdominal pain. A fever may mean the infection is in your kidneys. Other symptoms of a kidney infection include pain in your back or sides below the ribs, nausea, and vomiting. °DIAGNOSIS °To diagnose a UTI, your caregiver will ask you about your symptoms. Your caregiver also will ask to provide a urine sample. The urine sample will be tested for bacteria and white blood cells. White blood cells are made by your body to help fight infection. °TREATMENT  °Typically, UTIs can be treated with medication. Because  most UTIs are caused by a bacterial infection, they usually can be treated with the use of antibiotics. The choice of antibiotic and length of treatment depend on your symptoms and the type of bacteria causing your infection. °HOME CARE INSTRUCTIONS °· If you were prescribed antibiotics, take them exactly as your caregiver instructs you. Finish the medication even if you feel better after you have only taken some of the medication. °· Drink enough water and fluids to keep your urine clear or pale yellow. °· Avoid caffeine, tea, and carbonated beverages. They tend to irritate your bladder. °· Empty your bladder often. Avoid holding urine for long periods of time. °· Empty your bladder before and after sexual intercourse. °· After a bowel movement, women should cleanse from front to back. Use each tissue only once. °SEEK MEDICAL CARE IF:  °· You have back pain. °· You develop a fever. °· Your symptoms do not begin to resolve within 3 days. °SEEK IMMEDIATE MEDICAL CARE IF:  °· You have severe back pain or lower abdominal pain. °· You develop chills. °· You have nausea or vomiting. °· You have continued burning or discomfort with urination. °MAKE SURE YOU:  °· Understand these instructions. °· Will watch your condition. °· Will get help right away   if you are not doing well or get worse. °Document Released: 02/03/2005 Document Revised: 10/26/2011 Document Reviewed: 06/04/2011 °ExitCare® Patient Information ©2015 [employer]. This information is not intended to replace advice given to you by your health care provider. Make sure you discuss any questions you have with your health care provider. ° °

## 2014-06-30 NOTE — ED Notes (Signed)
Pt states that she has had painful urination along with urinary frequency for 5 days. Pt states that she believes she has a UTI.

## 2014-07-03 LAB — URINE CULTURE: Colony Count: >=100000

## 2014-07-03 NOTE — ED Notes (Signed)
Urine culture: >100,000 colonies E. Coli.  Pt. adequately treated with Ceftin. Roselyn Meier 07/03/2014

## 2014-08-30 NOTE — Op Note (Signed)
PATIENT NAME:  [name], [name] MR#:  [id number] DATE OF BIRTH:  01-16-1951  DATE OF PROCEDURE:  12/08/2012  PREOPERATIVE DIAGNOSES: Postmenopausal bleeding, fibroids along with submucosal polyp.   POSTOPERATIVE DIAGNOSES:  Postmenopausal bleeding, fibroids along with submucosal polyp.  SURGEON: Delsa Sale, M.D.   ASSISTANT:  Prentice Docker, M.D.   ESTIMATED BLOOD LOSS: Approximately 75 mL.   PROCEDURE PERFORMED: Laparoscopic supracervical hysterectomy with morcellation and bilateral salpingo-oophorectomy with cystoscopy.   FINDINGS: Approximately 12-week size multinodular uterus with normal appearing postmenopausal ovaries and tubes.   PROCEDURE: The patient was taken to the operating room and placed in supine position. After adequate general endotracheal anesthesia was instilled, the patient was prepped and draped in the usual sterile fashion. A side-opening speculum was placed in the patient's vagina and the anterior lip of the cervix was grasped with a single-tooth tenaculum. Timeout was performed and the Hulka tenaculum was then placed. The side-opening speculum was removed. Attention was then turned to the umbilicus which was injected with Marcaine. An incision was made. The Veress needle was placed. Hang drop test, fluid instillation test and fluid aspiration test showed proper placement of the Veress needle. The CO2 was then attached and placed on low flow. When tympany was heard around the liver, CO2 was placed on high flow. The Xcel trocar was then placed under direct visualization. The patient was placed in Trendelenburg after entering into the abdomen. The aforementioned findings were seen. The lighted camera was then used to place a 5 mm trocar port in the right lower quadrant and 11 mm trocar port in the left lower quadrant. Harmonic scalpel was then placed in the abdomen and first, the right infundibulopelvic ligament was grasped, cut and ligated, then serial bytes were taken down  between the uterus and the sidewall. The peritoneum was then incised down to the level of the bladder flap. The uterine arteries were skeletonized and identified. They were then grasped and cauterized with good hemostasis. Kleppinger was used to cauterize one small bleeding vessel on the edge of the sidewall. In a similar fashion, the Harmonic scalpel was used to cut across the left infundibulopelvic ligament which was grasped, cauterized, cut and ligated. Serial bites were then taken down through the round ligament and the broad ligament between the uterus and the sidewall. A bladder flap was created. The uterine arteries were skeletonized. The uterine arteries were then grasped with the Harmonic and cauterized. Serial bites with the Harmonic scalpel were then taken across the cervicouterine isthmus to separate the uterus from the cervix. The uterus was very bulky and with some difficulty, the uterus and cervix were separated. The Hulka tenaculum was removed. The interior of the os was cauterized with the Kleppinger. Good hemostasis was identified. The morcellator was then placed into the belly, and the 11 mm trocar port was removed. The morcellator was then used to morcellate the uterus in several bites. Ovaries were then removed from the abdomen. The abdomen was copiously irrigated with warm normal saline. Other small pieces of uterine tissue were removed. Interceed was placed across the cervical stump. The bowels were allowed to fall back into the abdomen. Good hemostasis was identified.  Attention was then turned to the bladder where cystoscopy was performed with good efflux of urine through both ureters bilaterally. Please also note, upon first entering the uterus after the patient was placed in Trendelenburg, please note, the ureters were then searched for and identified at the edge of the pelvic brim going down to  the bladder insertion. At the very end again, the cystoscope was then removed. The patient was  laid supine.  Blue-tinged urine was noted in the Foley bag from the methylene blue that had been given. The patient was taken to recovery after having tolerated the procedure well.     ____________________________ Delsa Sale, MD cck:nts D: 12/08/2012 00:29:00 ET T: 12/08/2012 01:15:08 ET JOB#: [id number]  cc: Delsa Sale, MD, <Dictator> Delsa Sale MD ELECTRONICALLY SIGNED 12/08/2012 13:02

## 2015-05-21 ENCOUNTER — Encounter (HOSPITAL_COMMUNITY): Payer: Self-pay | Admitting: Emergency Medicine

## 2015-05-21 ENCOUNTER — Emergency Department (INDEPENDENT_AMBULATORY_CARE_PROVIDER_SITE_OTHER)
Admission: EM | Admit: 2015-05-21 | Discharge: 2015-05-21 | Disposition: A | Discharge status: Home / Self Care | Payer: Medicare HMO | Source: Home / Self Care | Attending: Emergency Medicine | Admitting: Emergency Medicine

## 2015-05-21 ENCOUNTER — Emergency Department (INDEPENDENT_AMBULATORY_CARE_PROVIDER_SITE_OTHER): Payer: Medicare HMO

## 2015-05-21 DIAGNOSIS — S93602A Unspecified sprain of left foot, initial encounter: Secondary | ICD-10-CM

## 2015-05-21 MED ORDER — IBUPROFEN 600 MG PO TABS: 600.0000 mg | ORAL_TABLET | Freq: Four times a day (QID) | ORAL | Status: DC | PRN

## 2015-05-21 MED ORDER — HYDROCODONE-ACETAMINOPHEN 5-325 MG PO TABS: 2.0000 | ORAL_TABLET | ORAL | Status: DC | PRN

## 2015-05-21 NOTE — ED Provider Notes (Signed)
HPI  SUBJECTIVE:  [name] is a 65 y.o. female who presents with right foot pain, swelling, bruising after having a slip and fall today on the ice. Patient states that she landed with her left leg bent across her foot. She reports dull achy pain at the first metatarsal, MTP area. No numbness tingling. No injury to the ankle. No limitation of motion. Symptoms worse weightbearing, palpation, walking, no alleviating factors. She tried ibuprofen 400 mg that improvement. Past medical history of hypertension, anxiety. No history of diabetes, osteoporosis. She is not a smoker    Past Medical History  Diagnosis Date  . Hypertension   . Anxiety   . GERD (gastroesophageal reflux disease)     Past Surgical History  Procedure Laterality Date  . Abdominal hysterectomy    . Appendectomy    . Cholecystectomy      History reviewed. No pertinent family history.  Social History  Substance Use Topics  . Smoking status: Never Smoker   . Smokeless tobacco: None  . Alcohol Use: No    No current facility-administered medications for this encounter.  Current outpatient prescriptions:  .  cyclobenzaprine (FLEXERIL) 10 MG tablet, Take 10 mg by mouth at bedtime., Disp: , Rfl:  .  HYDROcodone-acetaminophen (NORCO/VICODIN) 5-325 MG tablet, Take 2 tablets by mouth every 4 (four) hours as needed for moderate pain., Disp: 20 tablet, Rfl: 0 .  ibuprofen (ADVIL,MOTRIN) 600 MG tablet, Take 1 tablet (600 mg total) by mouth every 6 (six) hours as needed., Disp: 30 tablet, Rfl: 0 .  metoprolol (LOPRESSOR) 50 MG tablet, Take 50 mg by mouth 2 (two) times daily., Disp: , Rfl:  .  pantoprazole (PROTONIX) 40 MG tablet, Take 40 mg by mouth daily., Disp: , Rfl:  .  sertraline (ZOLOFT) 100 MG tablet, Take 200 mg by mouth daily., Disp: , Rfl:  .  traZODone (DESYREL) 150 MG tablet, Take by mouth at bedtime., Disp: , Rfl:   Allergies  Allergen Reactions  . Erythromycin   . Valium [Diazepam]      ROS  As noted  in HPI.   Physical Exam  BP 163/77 mmHg  Pulse 55  Temp(Src) 98.2 F (36.8 C) (Oral)  Resp 12  SpO2 98%  Constitutional: Well developed, well nourished, no acute distress Eyes:  EOMI, conjunctiva normal bilaterally HENT: Normocephalic, atraumatic,mucus membranes moist Respiratory: Normal inspiratory effort Cardiovascular: Normal rate GI: nondistended skin: No rash, skin intact Musculoskeletal: Right  midfoot  tender. Tenderness at 1st MTP and at first metatarsal. Base of fifth metatarsal NT. + bruising. Skin intact. DP 2+. Refill less than 2 seconds. Sensation grossly intact. Patient able to move all toes actively.  no pain with inversion / eversion,  dorsiflexion / plantarflexion.  Distal fibula NT, Medial malleolus NT,  Deltoid ligament NT, Lateral ligaments NT, Achilles NT. Patient able to bear weight while in department. Neurologic: Alert & oriented x 3, no focal neuro deficits Psychiatric: Speech and behavior appropriate   ED Course   Medications - No data to display  Orders Placed This Encounter  Procedures  . DG Foot Complete Right    Standing Status: Standing     Number of Occurrences: 1     Standing Expiration Date:     Order Specific Question:  Reason for Exam (SYMPTOM  OR DIAGNOSIS REQUIRED)    Answer:  fall, diffuse bruise, tender 1st MT, MTP joint r/o fx, dislocation    No results found for this or any previous visit (from the  past 24 hour(s)). Dg Foot Complete Right  05/21/2015  CLINICAL DATA:  Acute right foot pain after slipping on ice today. Initial encounter. EXAM: RIGHT FOOT COMPLETE - 3+ VIEW COMPARISON:  None. FINDINGS: There is no evidence of fracture or dislocation. There is no evidence of arthropathy or other focal bone abnormality. Soft tissues are unremarkable. IMPRESSION: Normal right foot. Electronically Signed   By: Marijo Conception, M.D.   On: 05/21/2015 22:15    ED Clinical Impression  Foot sprain, left, initial encounter   ED  Assessment/Plan  Reviewed imaging independently. No fracture dislocation. See radiology report for full details.  Presentation is consistent with a sprain/strain of the foot. Home with cane, Norco, short course of ibuprofen , follow-up with orthopedic surgeon as needed. Continue rest, ice, compression, elevation.   Discussed  imaging, MDM, plan and followup with patient. Discussed sn/sx that should prompt return to the UC or ED. Patient agrees with plan.   *This clinic note was created using Dragon dictation software. Therefore, there may be occasional mistakes despite careful proofreading.  ?    Melynda Ripple, MD 05/21/15 [id number]

## 2015-05-21 NOTE — ED Notes (Signed)
Patient fel this evening, feet slipped out from under patient.  Patient landed on right foot, but did not sit down on foot.  Foot with bruising, swelling, and pedal pulses 2 plus.  Patient able to move foot in random range of motion

## 2015-08-03 ENCOUNTER — Emergency Department (INDEPENDENT_AMBULATORY_CARE_PROVIDER_SITE_OTHER)
Admission: EM | Admit: 2015-08-03 | Discharge: 2015-08-03 | Disposition: A | Discharge status: Home / Self Care | Payer: Medicare HMO | Source: Home / Self Care | Attending: Emergency Medicine | Admitting: Emergency Medicine

## 2015-08-03 ENCOUNTER — Encounter (HOSPITAL_COMMUNITY): Payer: Self-pay | Admitting: Emergency Medicine

## 2015-08-03 DIAGNOSIS — R0982 Postnasal drip: Secondary | ICD-10-CM

## 2015-08-03 DIAGNOSIS — J302 Other seasonal allergic rhinitis: Secondary | ICD-10-CM

## 2015-08-03 DIAGNOSIS — H6983 Other specified disorders of Eustachian tube, bilateral: Secondary | ICD-10-CM

## 2015-08-03 DIAGNOSIS — T700XXA Otitic barotrauma, initial encounter: Secondary | ICD-10-CM | POA: Diagnosis not present

## 2015-08-03 NOTE — ED Notes (Signed)
C/o cold sx onset x5 days associated w/ST, weakness, dry cough, fevers, and bilateral ear pain Taking OTC allergy meds w/no relief A&O x4... No acute distress.

## 2015-08-03 NOTE — ED Provider Notes (Signed)
CSN: [id number]     Arrival date & time 08/03/15  1301 History   First MD Initiated Contact with Patient 08/03/15 1323     Chief Complaint  Patient presents with  . URI   (Consider location/radiation/quality/duration/timing/severity/associated sxs/prior Treatment) HPI Comments: Sick decision female complaining of tonight's of sore throat and earaches. She believes her temperature has been approximately 100. She is also feeling a little achy and anterior chest feels weak. Denies heaviness, tightness, fullness, pressure, squeezing or other types of chest discomfort. She does have an occasional cough as well as PND and sometimes a little bit of shortness of breath.  Patient is a 65 y.o. female presenting with URI.  URI Presenting symptoms: congestion, cough, ear pain, fever, rhinorrhea and sore throat   Presenting symptoms: no fatigue   Associated symptoms: no neck pain     Past Medical History  Diagnosis Date  . Hypertension   . Anxiety   . GERD (gastroesophageal reflux disease)    Past Surgical History  Procedure Laterality Date  . Abdominal hysterectomy    . Appendectomy    . Cholecystectomy     No family history on file. Social History  Substance Use Topics  . Smoking status: Never Smoker   . Smokeless tobacco: None  . Alcohol Use: No   OB History    No data available     Review of Systems  Constitutional: Positive for fever and activity change. Negative for chills, appetite change and fatigue.  HENT: Positive for congestion, ear pain, postnasal drip, rhinorrhea and sore throat. Negative for facial swelling.   Eyes: Negative.   Respiratory: Positive for cough.   Cardiovascular: Negative.   Musculoskeletal: Negative for neck pain and neck stiffness.  Skin: Negative.  Negative for pallor and rash.  Neurological: Negative.     Allergies  Erythromycin and Valium  Home Medications   Prior to Admission medications   Medication Sig Start Date End Date Taking?  Authorizing Provider  metoprolol (LOPRESSOR) 50 MG tablet Take 50 mg by mouth 2 (two) times daily.   Yes Historical Provider, MD  pantoprazole (PROTONIX) 40 MG tablet Take 40 mg by mouth daily.   Yes Historical Provider, MD  sertraline (ZOLOFT) 100 MG tablet Take 200 mg by mouth daily.   Yes Historical Provider, MD  traZODone (DESYREL) 150 MG tablet Take by mouth at bedtime.   Yes Historical Provider, MD  cyclobenzaprine (FLEXERIL) 10 MG tablet Take 10 mg by mouth at bedtime.    Historical Provider, MD  HYDROcodone-acetaminophen (NORCO/VICODIN) 5-325 MG tablet Take 2 tablets by mouth every 4 (four) hours as needed for moderate pain. 05/21/15   Melynda Ripple, MD  ibuprofen (ADVIL,MOTRIN) 600 MG tablet Take 1 tablet (600 mg total) by mouth every 6 (six) hours as needed. 05/21/15   Melynda Ripple, MD   Meds Ordered and Administered this Visit  Medications - No data to display  BP 115/65 mmHg  Pulse 68  Temp(Src) 98.1 F (36.7 C) (Oral)  Resp 17  SpO2 99% No data found.   Physical Exam  Constitutional: She is oriented to person, place, and time. She appears well-developed and well-nourished. No distress.  HENT:  Mouth/Throat: No oropharyngeal exudate.  Bilateral TMs retracted. The left ear has a trace of blood at the base of the TM in the ear canal. The TM appears to be unaffected. There is no erythema or bulging. It is mildly retracted paranasal scratch marks or apparent source of the small amount of bleeding. The  drum is intact no signs of rupture. Right TM retracted otherwise pearly gray and transparent without erythema.  Oropharynx with moderate amount of clear PND, erythema and much cobblestoning.  Eyes: Conjunctivae and EOM are normal.  Neck: Normal range of motion. Neck supple.  Cardiovascular: Normal rate, regular rhythm and normal heart sounds.   Pulmonary/Chest: Effort normal and breath sounds normal. No respiratory distress. She has no wheezes. She has no rales.   Musculoskeletal: Normal range of motion. She exhibits no edema.  Lymphadenopathy:    She has no cervical adenopathy.  Neurological: She is alert and oriented to person, place, and time.  Skin: Skin is warm and dry. No rash noted.  Psychiatric: She has a normal mood and affect.  Nursing note and vitals reviewed.   ED Course  Procedures (including critical care time)  Labs Review Labs Reviewed - No data to display  Imaging Review No results found.   Visual Acuity Review  Right Eye Distance:   Left Eye Distance:   Bilateral Distance:    Right Eye Near:   Left Eye Near:    Bilateral Near:         MDM   1. Other seasonal allergic rhinitis   2. Barotitis media, initial encounter   3. ETD (eustachian tube dysfunction), bilateral   4. PND (post-nasal drip)    For nasal and head congestion may take Sudafed PE 10 mg every 4 hours as needed. Saline nasal spray used frequently. Also recommend taking around-the-clock for Flonase daily. For drainage may use Allegra, Claritin or Zyrtec. If you need stronger medicine to stop drainage may take Chlor-Trimeton 2-4 mg every 4 hours. This may cause drowsiness. Ibuprofen 400 mg every 6 hours as needed for pain, discomfort or fever. Drink plenty of fluids and stay well-hydrated. Zaditor eyedrops 1 drop in each eye twice a day for allergies.     Janne Napoleon, NP 08/03/15 [phone]

## 2015-08-03 NOTE — Discharge Instructions (Signed)
Allergic Rhinitis For nasal and head congestion may take Sudafed PE 10 mg every 4 hours as needed. Saline nasal spray used frequently. Also recommend taking around-the-clock for Flonase daily. For drainage may use Allegra, Claritin or Zyrtec. If you need stronger medicine to stop drainage may take Chlor-Trimeton 2-4 mg every 4 hours. This may cause drowsiness. Ibuprofen 400 mg every 6 hours as needed for pain, discomfort or fever. Drink plenty of fluids and stay well-hydrated. Zaditor eyedrops 1 drop in each eye twice a day for allergies.  Allergic rhinitis is when the mucous membranes in the nose respond to allergens. Allergens are particles in the air that cause your body to have an allergic reaction. This causes you to release allergic antibodies. Through a chain of events, these eventually cause you to release histamine into the blood stream. Although meant to protect the body, it is this release of histamine that causes your discomfort, such as frequent sneezing, congestion, and an itchy, runny nose.  CAUSES Seasonal allergic rhinitis (hay fever) is caused by pollen allergens that may come from grasses, trees, and weeds. Year-round allergic rhinitis (perennial allergic rhinitis) is caused by allergens such as house dust mites, pet dander, and mold spores. SYMPTOMS  Nasal stuffiness (congestion).  Itchy, runny nose with sneezing and tearing of the eyes. DIAGNOSIS Your health care provider can help you determine the allergen or allergens that trigger your symptoms. If you and your health care provider are unable to determine the allergen, skin or blood testing may be used. Your health care provider will diagnose your condition after taking your health history and performing a physical exam. Your health care provider may assess you for other related conditions, such as asthma, pink eye, or an ear infection. TREATMENT Allergic rhinitis does not have a cure, but it can be controlled  by:  Medicines that block allergy symptoms. These may include allergy shots, nasal sprays, and oral antihistamines.  Avoiding the allergen. Hay fever may often be treated with antihistamines in pill or nasal spray forms. Antihistamines block the effects of histamine. There are over-the-counter medicines that may help with nasal congestion and swelling around the eyes. Check with your health care provider before taking or giving this medicine. If avoiding the allergen or the medicine prescribed do not work, there are many new medicines your health care provider can prescribe. Stronger medicine may be used if initial measures are ineffective. Desensitizing injections can be used if medicine and avoidance does not work. Desensitization is when a patient is given ongoing shots until the body becomes less sensitive to the allergen. Make sure you follow up with your health care provider if problems continue. HOME CARE INSTRUCTIONS It is not possible to completely avoid allergens, but you can reduce your symptoms by taking steps to limit your exposure to them. It helps to know exactly what you are allergic to so that you can avoid your specific triggers. SEEK MEDICAL CARE IF:  You have a fever.  You develop a cough that does not stop easily (persistent).  You have shortness of breath.  You start wheezing.  Symptoms interfere with normal daily activities.   This information is not intended to replace advice given to you by your health care provider. Make sure you discuss any questions you have with your health care provider.   Document Released: 01/19/2001 Document Revised: 05/17/2014 Document Reviewed: 01/01/2013 Elsevier Interactive Patient Education 2016 [place] media is inflammation of your middle ear. This occurs when  the auditory tube (eustachian tube) leading from the back of your nose (nasopharynx) to your eardrum is blocked. This blockage may result from  a cold, environmental allergies, or an upper respiratory infection. Unresolved barotitis media may lead to damage or hearing loss (barotrauma), which may become permanent. HOME CARE INSTRUCTIONS   Use medicines as recommended by your health care provider. Over-the-counter medicines will help unblock the canal and can help during times of air travel.  Do not put anything into your ears to clean or unplug them. Eardrops will not be helpful.  Do not swim, dive, or fly until your health care provider says it is all right to do so. If these activities are necessary, chewing gum with frequent, forceful swallowing may help. It is also helpful to hold your nose and gently blow to pop your ears for equalizing pressure changes. This forces air into the eustachian tube.  Only take over-the-counter or prescription medicines for pain, discomfort, or fever as directed by your health care provider.  A decongestant may be helpful in decongesting the middle ear and make pressure equalization easier. SEEK MEDICAL CARE IF:  You experience a serious form of dizziness in which you feel as if the room is spinning and you feel nauseated (vertigo).  Your symptoms only involve one ear. SEEK IMMEDIATE MEDICAL CARE IF:   You develop a severe headache, dizziness, or severe ear pain.  You have bloody or pus-like drainage from your ears.  You develop a fever.  Your problems do not improve or become worse. MAKE SURE YOU:   Understand these instructions.  Will watch your condition.  Will get help right away if you are not doing well or get worse.   This information is not intended to replace advice given to you by your health care provider. Make sure you discuss any questions you have with your health care provider.   Document Released: 04/23/2000 Document Revised: 02/14/2013 Document Reviewed: 11/21/2012 Elsevier Interactive Patient Education [employer].

## 2015-12-01 DIAGNOSIS — M542 Cervicalgia: Secondary | ICD-10-CM | POA: Diagnosis not present

## 2015-12-01 DIAGNOSIS — I10 Essential (primary) hypertension: Secondary | ICD-10-CM | POA: Diagnosis not present

## 2015-12-01 DIAGNOSIS — M545 Low back pain: Secondary | ICD-10-CM | POA: Diagnosis not present

## 2015-12-01 DIAGNOSIS — E7800 Pure hypercholesterolemia, unspecified: Secondary | ICD-10-CM | POA: Diagnosis not present

## 2015-12-01 DIAGNOSIS — R69 Illness, unspecified: Secondary | ICD-10-CM | POA: Diagnosis not present

## 2015-12-01 DIAGNOSIS — R5383 Other fatigue: Secondary | ICD-10-CM | POA: Diagnosis not present

## 2015-12-01 DIAGNOSIS — E559 Vitamin D deficiency, unspecified: Secondary | ICD-10-CM | POA: Diagnosis not present

## 2015-12-09 DIAGNOSIS — I5030 Unspecified diastolic (congestive) heart failure: Secondary | ICD-10-CM | POA: Diagnosis not present

## 2015-12-09 DIAGNOSIS — R69 Illness, unspecified: Secondary | ICD-10-CM | POA: Diagnosis not present

## 2015-12-09 DIAGNOSIS — K219 Gastro-esophageal reflux disease without esophagitis: Secondary | ICD-10-CM | POA: Diagnosis not present

## 2015-12-09 DIAGNOSIS — Z0000 Encounter for general adult medical examination without abnormal findings: Secondary | ICD-10-CM | POA: Diagnosis not present

## 2015-12-09 DIAGNOSIS — I110 Hypertensive heart disease with heart failure: Secondary | ICD-10-CM | POA: Diagnosis not present

## 2016-03-03 DIAGNOSIS — R39198 Other difficulties with micturition: Secondary | ICD-10-CM | POA: Diagnosis not present

## 2016-03-03 DIAGNOSIS — R300 Dysuria: Secondary | ICD-10-CM | POA: Diagnosis not present

## 2016-03-03 DIAGNOSIS — R351 Nocturia: Secondary | ICD-10-CM | POA: Diagnosis not present

## 2016-03-03 DIAGNOSIS — Z23 Encounter for immunization: Secondary | ICD-10-CM | POA: Diagnosis not present

## 2016-03-19 DIAGNOSIS — R300 Dysuria: Secondary | ICD-10-CM | POA: Diagnosis not present

## 2016-03-19 DIAGNOSIS — N815 Vaginal enterocele: Secondary | ICD-10-CM | POA: Diagnosis not present

## 2016-03-19 DIAGNOSIS — Z1231 Encounter for screening mammogram for malignant neoplasm of breast: Secondary | ICD-10-CM | POA: Diagnosis not present

## 2016-03-19 DIAGNOSIS — R1030 Lower abdominal pain, unspecified: Secondary | ICD-10-CM | POA: Diagnosis not present

## 2016-03-22 ENCOUNTER — Encounter: Payer: Self-pay | Admitting: *Deleted

## 2016-03-24 ENCOUNTER — Other Ambulatory Visit: Payer: Self-pay | Admitting: Family Medicine

## 2016-03-24 DIAGNOSIS — R928 Other abnormal and inconclusive findings on diagnostic imaging of breast: Secondary | ICD-10-CM

## 2016-03-29 ENCOUNTER — Inpatient Hospital Stay
Admission: RE | Admit: 2016-03-29 | Discharge: 2016-03-29 | Disposition: A | Discharge status: Home / Self Care | Payer: Self-pay | Source: Ambulatory Visit | Attending: *Deleted | Admitting: *Deleted

## 2016-03-29 ENCOUNTER — Other Ambulatory Visit: Payer: Self-pay | Admitting: *Deleted

## 2016-03-29 DIAGNOSIS — Z9289 Personal history of other medical treatment: Secondary | ICD-10-CM

## 2016-04-21 ENCOUNTER — Ambulatory Visit
Admission: RE | Admit: 2016-04-21 | Discharge: 2016-04-21 | Disposition: A | Discharge status: Home / Self Care | Payer: Medicare HMO | Source: Ambulatory Visit | Attending: Family Medicine | Admitting: Family Medicine

## 2016-04-21 DIAGNOSIS — R928 Other abnormal and inconclusive findings on diagnostic imaging of breast: Secondary | ICD-10-CM

## 2016-04-21 DIAGNOSIS — R921 Mammographic calcification found on diagnostic imaging of breast: Secondary | ICD-10-CM | POA: Diagnosis not present

## 2016-04-23 ENCOUNTER — Other Ambulatory Visit: Payer: Self-pay | Admitting: Family Medicine

## 2016-04-23 DIAGNOSIS — R928 Other abnormal and inconclusive findings on diagnostic imaging of breast: Secondary | ICD-10-CM

## 2016-04-23 DIAGNOSIS — R921 Mammographic calcification found on diagnostic imaging of breast: Secondary | ICD-10-CM

## 2016-05-06 ENCOUNTER — Ambulatory Visit

## 2016-05-11 ENCOUNTER — Ambulatory Visit
Admission: RE | Admit: 2016-05-11 | Discharge: 2016-05-11 | Disposition: A | Discharge status: Home / Self Care | Payer: Medicare HMO | Source: Ambulatory Visit | Attending: Family Medicine | Admitting: Family Medicine

## 2016-05-11 DIAGNOSIS — R928 Other abnormal and inconclusive findings on diagnostic imaging of breast: Secondary | ICD-10-CM

## 2016-05-11 DIAGNOSIS — D241 Benign neoplasm of right breast: Secondary | ICD-10-CM | POA: Insufficient documentation

## 2016-05-11 DIAGNOSIS — R921 Mammographic calcification found on diagnostic imaging of breast: Secondary | ICD-10-CM | POA: Insufficient documentation

## 2016-05-11 DIAGNOSIS — N6081 Other benign mammary dysplasias of right breast: Secondary | ICD-10-CM | POA: Insufficient documentation

## 2016-05-11 HISTORY — PX: BREAST BIOPSY: SHX20

## 2016-05-12 LAB — SURGICAL PATHOLOGY

## 2016-06-14 DIAGNOSIS — R197 Diarrhea, unspecified: Secondary | ICD-10-CM | POA: Diagnosis not present

## 2016-06-15 ENCOUNTER — Encounter: Payer: Self-pay | Admitting: Obstetrics and Gynecology

## 2016-06-15 DIAGNOSIS — R69 Illness, unspecified: Secondary | ICD-10-CM | POA: Diagnosis not present

## 2016-06-15 DIAGNOSIS — Z0000 Encounter for general adult medical examination without abnormal findings: Secondary | ICD-10-CM | POA: Diagnosis not present

## 2016-06-15 DIAGNOSIS — K649 Unspecified hemorrhoids: Secondary | ICD-10-CM | POA: Diagnosis not present

## 2016-06-15 DIAGNOSIS — I10 Essential (primary) hypertension: Secondary | ICD-10-CM | POA: Diagnosis not present

## 2016-06-15 DIAGNOSIS — M545 Low back pain: Secondary | ICD-10-CM | POA: Diagnosis not present

## 2016-06-15 DIAGNOSIS — K219 Gastro-esophageal reflux disease without esophagitis: Secondary | ICD-10-CM | POA: Diagnosis not present

## 2016-06-15 DIAGNOSIS — R197 Diarrhea, unspecified: Secondary | ICD-10-CM | POA: Diagnosis not present

## 2016-06-15 DIAGNOSIS — M1712 Unilateral primary osteoarthritis, left knee: Secondary | ICD-10-CM | POA: Diagnosis not present

## 2016-06-15 DIAGNOSIS — G4700 Insomnia, unspecified: Secondary | ICD-10-CM | POA: Diagnosis not present

## 2016-06-15 DIAGNOSIS — R600 Localized edema: Secondary | ICD-10-CM | POA: Diagnosis not present

## 2016-06-29 DIAGNOSIS — I471 Supraventricular tachycardia: Secondary | ICD-10-CM | POA: Diagnosis not present

## 2016-06-29 DIAGNOSIS — Z803 Family history of malignant neoplasm of breast: Secondary | ICD-10-CM | POA: Diagnosis not present

## 2016-06-29 DIAGNOSIS — K219 Gastro-esophageal reflux disease without esophagitis: Secondary | ICD-10-CM | POA: Diagnosis not present

## 2016-06-29 DIAGNOSIS — D241 Benign neoplasm of right breast: Secondary | ICD-10-CM | POA: Diagnosis not present

## 2016-07-09 ENCOUNTER — Other Ambulatory Visit: Payer: Self-pay | Admitting: General Surgery

## 2016-07-09 DIAGNOSIS — D241 Benign neoplasm of right breast: Secondary | ICD-10-CM

## 2016-07-12 ENCOUNTER — Other Ambulatory Visit: Payer: Self-pay | Admitting: General Surgery

## 2016-07-12 DIAGNOSIS — D241 Benign neoplasm of right breast: Secondary | ICD-10-CM

## 2016-07-29 ENCOUNTER — Encounter (HOSPITAL_COMMUNITY)
Admission: RE | Admit: 2016-07-29 | Discharge: 2016-07-29 | Disposition: A | Discharge status: Home / Self Care | Payer: Medicare HMO | Source: Ambulatory Visit | Attending: General Surgery | Admitting: General Surgery

## 2016-07-29 ENCOUNTER — Encounter (HOSPITAL_COMMUNITY): Payer: Self-pay

## 2016-07-29 DIAGNOSIS — Z01812 Encounter for preprocedural laboratory examination: Secondary | ICD-10-CM | POA: Diagnosis not present

## 2016-07-29 DIAGNOSIS — R001 Bradycardia, unspecified: Secondary | ICD-10-CM | POA: Diagnosis not present

## 2016-07-29 DIAGNOSIS — D241 Benign neoplasm of right breast: Secondary | ICD-10-CM | POA: Diagnosis not present

## 2016-07-29 DIAGNOSIS — Z01810 Encounter for preprocedural cardiovascular examination: Secondary | ICD-10-CM | POA: Diagnosis not present

## 2016-07-29 DIAGNOSIS — I10 Essential (primary) hypertension: Secondary | ICD-10-CM | POA: Insufficient documentation

## 2016-07-29 HISTORY — DX: Other complications of anesthesia, initial encounter: T88.59XA

## 2016-07-29 HISTORY — DX: Unspecified osteoarthritis, unspecified site: M19.90

## 2016-07-29 HISTORY — DX: Major depressive disorder, single episode, unspecified: F32.9

## 2016-07-29 HISTORY — DX: Depression, unspecified: F32.A

## 2016-07-29 HISTORY — DX: Adverse effect of unspecified anesthetic, initial encounter: T41.45XA

## 2016-07-29 LAB — COMPREHENSIVE METABOLIC PANEL
ALK PHOS: 54 U/L (ref 38–126)
ALT: 25 U/L (ref 14–54)
AST: 31 U/L (ref 15–41)
Albumin: 4.2 g/dL (ref 3.5–5.0)
Anion gap: 9 (ref 5–15)
BILIRUBIN TOTAL: 0.7 mg/dL (ref 0.3–1.2)
BUN: 28 mg/dL — AB (ref 6–20)
CALCIUM: 9.9 mg/dL (ref 8.9–10.3)
CO2: 26 mmol/L (ref 22–32)
CREATININE: 1.49 mg/dL — AB (ref 0.44–1.00)
Chloride: 105 mmol/L (ref 101–111)
GFR, EST AFRICAN AMERICAN: 41 mL/min — AB (ref >60)
GFR, EST NON AFRICAN AMERICAN: 35 mL/min — AB (ref >60)
Glucose, Bld: 104 mg/dL — ABNORMAL HIGH (ref 65–99)
Potassium: 4.2 mmol/L (ref 3.5–5.1)
Sodium: 140 mmol/L (ref 135–145)
Total Protein: 7 g/dL (ref 6.5–8.1)

## 2016-07-29 LAB — CBC WITH DIFFERENTIAL/PLATELET
BASOS ABS: 0 10*3/uL (ref 0.0–0.1)
Basophils Relative: 1 %
Eosinophils Absolute: 0.1 10*3/uL (ref 0.0–0.7)
Eosinophils Relative: 2 %
HEMATOCRIT: 40.8 % (ref 36.0–46.0)
HEMOGLOBIN: 13.6 g/dL (ref 12.0–15.0)
LYMPHS ABS: 1.2 10*3/uL (ref 0.7–4.0)
LYMPHS PCT: 21 %
MCH: 30.6 pg (ref 26.0–34.0)
MCHC: 33.3 g/dL (ref 30.0–36.0)
MCV: 91.7 fL (ref 78.0–100.0)
Monocytes Absolute: 0.5 10*3/uL (ref 0.1–1.0)
Monocytes Relative: 8 %
NEUTROS ABS: 4 10*3/uL (ref 1.7–7.7)
NEUTROS PCT: 68 %
PLATELETS: 185 10*3/uL (ref 150–400)
RBC: 4.45 MIL/uL (ref 3.87–5.11)
RDW: 13 % (ref 11.5–15.5)
WBC: 5.8 10*3/uL (ref 4.0–10.5)

## 2016-08-01 NOTE — H&P (Signed)
[name] Location: [facility] Patient #: [id number] DOB: [birth year] Married / Language: English / Race: White Female        History of Present Illness  The patient is a 66 year old female who presents with a complaint of papilloma right breast. This is a very pleasant 66 year old female from [place]. She is referred by Dr. Pamelia Hoit at the [facility] for evaluation and excision of a papilloma in the right breast, lower outer quadrant. Her PCP is Dr. Threasa Alpha in [facility]      She has no prior history of breast problems. She gets annual mammograms. Recent mammograms show a 4 mm area of calcifications in the right breast, lower outer quadrant. Middle depth. No mass effect. Category B density. Pathology from image guided biopsy shows papilloma with sclerosis, some metaplasia and calcifications. No atypia.      Comorbidities include intermittent diarrhea. Takes beta blocker for and infrequent SVT. GERD on Protonix. No other medical problems. Fairly healthy. Family history reveals mother died of metastatic breast cancer age 55. Paternal aunt survived breast cancer. Maternal aunt had some type of gynecologic cancer and survived. Socially she is married lives in [place]. Her husband has PTSD and COPD and she cares for him. His healthcare is at the [facility] in [place]. They are married. Has 2 children. One of her sons lives nearby.      I told her that she probably does not have cancer, but that given the diagnosis of papilloma coupled with a first degree relative that died of metastatic breast cancer that her risk was a little bit too high to simply watch this. She fully agrees. She'll be scheduled for right breast lumpectomy with radioactive seed localization. I discussed the indications, details, techniques, and numerous risk of the surgery with her. She is aware the risks of bleeding, infection, cosmetic deformity,  reoperation if cancer is unexpectedly found, nerve damage with chronic pain, and other unforeseen problems. She understands these issues well. At this time all of her questions are answered. She agrees with this plan.   Past Surgical History  Appendectomy  Gallbladder Surgery - Laparoscopic  Hysterectomy (not due to cancer) - Partial  Tonsillectomy   Diagnostic Studies History  Colonoscopy  1-5 years ago Mammogram  within last year Pap Smear  1-5 years ago  Allergies  Erythromycin ([facility]) *DERMATOLOGICALS*  Valium *ANTIANXIETY AGENTS*   Medication History  Metoprolol Tartrate (50MG  Tablet, Oral) Active. Pantoprazole Sodium (40MG  Tablet DR, Oral) Active. Sertraline HCl (100MG  Tablet, Oral) Active. TraZODone HCl (150MG  Tablet, Oral) Active. Vitamin E (Oral) Specific strength unknown - Active. Multivitamins (Oral) Active. Vitamin D3 (Oral) Specific strength unknown - Active. Turmeric (Oral) Specific strength unknown - Active. Medications Reconciled  Social History  Caffeine use  Coffee, Tea. No alcohol use  No drug use  Tobacco use  Never smoker.  Family History  Anesthetic complications  Father, Mother. Arthritis  Father, Mother. Breast Cancer  Mother. Depression  Mother. Heart Disease  Father. Heart disease in female family member before age 40  Melanoma  Father.  Pregnancy / Birth History  Age at menarche  79 years. Age of menopause  51-55 Contraceptive History  Intrauterine device, Oral contraceptives. Gravida  2 Maternal age  76-20 Para  2  Other Problems  Anxiety Disorder  Arthritis  Back Pain  Bladder Problems  Depression  Gastroesophageal Reflux Disease  Hemorrhoids  High blood pressure  Hypercholesterolemia     Review of  Systems  General Present- Fatigue. Not Present- Appetite Loss, Chills, Fever, Night Sweats, Weight Gain and Weight Loss. Skin Not Present- Change in Wart/Mole, Dryness, Hives,  Jaundice, New Lesions, Non-Healing Wounds, Rash and Ulcer. HEENT Present- Seasonal Allergies. Not Present- Earache, Hearing Loss, Hoarseness, Nose Bleed, Oral Ulcers, Ringing in the Ears, Sinus Pain, Sore Throat, Visual Disturbances, Wears glasses/contact lenses and Yellow Eyes. Respiratory Not Present- Bloody sputum, Chronic Cough, Difficulty Breathing, Snoring and Wheezing. Breast Not Present- Breast Mass, Breast Pain, Nipple Discharge and Skin Changes. Cardiovascular Present- Palpitations and Rapid Heart Rate. Not Present- Chest Pain, Difficulty Breathing Lying Down, Leg Cramps, Shortness of Breath and Swelling of Extremities. Gastrointestinal Present- Chronic diarrhea, Excessive gas, Hemorrhoids and Indigestion. Not Present- Abdominal Pain, Bloating, Bloody Stool, Change in Bowel Habits, Constipation, Difficulty Swallowing, Gets full quickly at meals, Nausea, Rectal Pain and Vomiting. Female Genitourinary Present- Frequency and Urgency. Not Present- Nocturia, Painful Urination and Pelvic Pain. Musculoskeletal Present- Back Pain, Joint Pain, Joint Stiffness, Muscle Pain and Muscle Weakness. Not Present- Swelling of Extremities. Neurological Present- Numbness, Tingling and Weakness. Not Present- Decreased Memory, Fainting, Headaches, Seizures, Tremor and Trouble walking. Psychiatric Present- Anxiety and Depression. Not Present- Bipolar, Change in Sleep Pattern, Fearful and Frequent crying. Endocrine Not Present- Cold Intolerance, Excessive Hunger, Hair Changes, Heat Intolerance, Hot flashes and New Diabetes. Hematology Present- Blood Thinners. Not Present- Easy Bruising, Excessive bleeding, Gland problems, HIV and Persistent Infections.  Vitals  Weight: 143.8 lb Height: 64in Body Surface Area: 1.7 m Body Mass Index: 24.68 kg/m  Temp.: 98.68F  Pulse: 59 (Regular)  BP: 126/76 (Sitting, Left Arm, Standard)    Physical Exam  General Mental Status-Alert. General  Appearance-Consistent with stated age. Hydration-Well hydrated. Voice-Normal.  Head and Neck Head-normocephalic, atraumatic with no lesions or palpable masses. Trachea-midline. Thyroid Gland Characteristics - normal size and consistency.  Eye Eyeball - Bilateral-Extraocular movements intact. Sclera/Conjunctiva - Bilateral-No scleral icterus.  Chest and Lung Exam Chest and lung exam reveals -quiet, even and easy respiratory effort with no use of accessory muscles and on auscultation, normal breath sounds, no adventitious sounds and normal vocal resonance. Inspection Chest Wall - Normal. Back - normal.  Breast Note: Both breasts are examined. There are large. Soft. No palpable mass. No skin changes other than tiny biopsy site on the right. No axillary adenopathy.   Cardiovascular Cardiovascular examination reveals -normal heart sounds, regular rate and rhythm with no murmurs and normal pedal pulses bilaterally.  Abdomen Inspection Inspection of the abdomen reveals - No Hernias. Skin - Scar - no surgical scars. Palpation/Percussion Palpation and Percussion of the abdomen reveal - Soft, Non Tender, No Rebound tenderness, No Rigidity (guarding) and No hepatosplenomegaly. Auscultation Auscultation of the abdomen reveals - Bowel sounds normal.  Neurologic Neurologic evaluation reveals -alert and oriented x 3 with no impairment of recent or remote memory. Mental Status-Normal.  Musculoskeletal Normal Exam - Left-Upper Extremity Strength Normal and Lower Extremity Strength Normal. Normal Exam - Right-Upper Extremity Strength Normal and Lower Extremity Strength Normal.  Lymphatic Head & Neck  General Head & Neck Lymphatics: Bilateral - Description - Normal. Axillary  General Axillary Region: Bilateral - Description - Normal. Tenderness - Non Tender. Femoral & Inguinal  Generalized Femoral & Inguinal Lymphatics: Bilateral - Description - Normal.  Tenderness - Non Tender.    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 You have a family  history of breast cancer in your mother, and in a paternal aunt  Most likely there is no cancer present, but your risk is high enough to where this area should be conservatively excised You agree with this plan  youwill be scheduled for right breast lumpectomy with radioactive seed localization This is an outpatient surgery, as we discussed. Someone will need to drive youhome I discussed the indications, techniques, and risk of the surgery in detail  FAMILY HISTORY OF BREAST CANCER IN FIRST DEGREE RELATIVE (Z80.3) PAROXYSMAL SVT (SUPRAVENTRICULAR TACHYCARDIA) (I47.1) Impression: Infrequent. On beta blockers. CHRONIC GERD (K21.9)    Theora Vankirk M. Dalbert Batman, M.D., [facility] General and Minimally invasive Surgery Breast and Colorectal Surgery Office:   [phone] Pager:   [phone]

## 2016-08-03 ENCOUNTER — Ambulatory Visit
Admission: RE | Admit: 2016-08-03 | Discharge: 2016-08-03 | Disposition: A | Discharge status: Home / Self Care | Payer: Medicare HMO | Source: Ambulatory Visit | Attending: General Surgery | Admitting: General Surgery

## 2016-08-03 DIAGNOSIS — D241 Benign neoplasm of right breast: Secondary | ICD-10-CM

## 2016-08-04 ENCOUNTER — Ambulatory Visit
Admission: RE | Admit: 2016-08-04 | Discharge: 2016-08-04 | Disposition: A | Discharge status: Home / Self Care | Source: Ambulatory Visit | Attending: General Surgery | Admitting: General Surgery

## 2016-08-04 ENCOUNTER — Ambulatory Visit (HOSPITAL_COMMUNITY): Payer: Medicare HMO | Admitting: Certified Registered"

## 2016-08-04 ENCOUNTER — Encounter (HOSPITAL_COMMUNITY): Payer: Self-pay

## 2016-08-04 ENCOUNTER — Encounter (HOSPITAL_COMMUNITY)
Admission: RE | Disposition: A | Discharge status: Home / Self Care | Payer: Self-pay | Source: Ambulatory Visit | Attending: General Surgery

## 2016-08-04 ENCOUNTER — Ambulatory Visit (HOSPITAL_COMMUNITY)
Admission: RE | Admit: 2016-08-04 | Discharge: 2016-08-04 | Disposition: A | Discharge status: Home / Self Care | Payer: Medicare HMO | Source: Ambulatory Visit | Attending: General Surgery | Admitting: General Surgery

## 2016-08-04 DIAGNOSIS — Z8049 Family history of malignant neoplasm of other genital organs: Secondary | ICD-10-CM | POA: Insufficient documentation

## 2016-08-04 DIAGNOSIS — I471 Supraventricular tachycardia: Secondary | ICD-10-CM | POA: Insufficient documentation

## 2016-08-04 DIAGNOSIS — N329 Bladder disorder, unspecified: Secondary | ICD-10-CM | POA: Insufficient documentation

## 2016-08-04 DIAGNOSIS — N6489 Other specified disorders of breast: Secondary | ICD-10-CM | POA: Diagnosis not present

## 2016-08-04 DIAGNOSIS — K219 Gastro-esophageal reflux disease without esophagitis: Secondary | ICD-10-CM | POA: Insufficient documentation

## 2016-08-04 DIAGNOSIS — N6011 Diffuse cystic mastopathy of right breast: Secondary | ICD-10-CM | POA: Diagnosis not present

## 2016-08-04 DIAGNOSIS — E7800 Pure hypercholesterolemia, unspecified: Secondary | ICD-10-CM | POA: Diagnosis not present

## 2016-08-04 DIAGNOSIS — D241 Benign neoplasm of right breast: Secondary | ICD-10-CM | POA: Diagnosis not present

## 2016-08-04 DIAGNOSIS — F419 Anxiety disorder, unspecified: Secondary | ICD-10-CM | POA: Insufficient documentation

## 2016-08-04 DIAGNOSIS — F329 Major depressive disorder, single episode, unspecified: Secondary | ICD-10-CM | POA: Insufficient documentation

## 2016-08-04 DIAGNOSIS — A088 Other specified intestinal infections: Secondary | ICD-10-CM | POA: Insufficient documentation

## 2016-08-04 DIAGNOSIS — Z803 Family history of malignant neoplasm of breast: Secondary | ICD-10-CM | POA: Diagnosis not present

## 2016-08-04 DIAGNOSIS — M1990 Unspecified osteoarthritis, unspecified site: Secondary | ICD-10-CM | POA: Insufficient documentation

## 2016-08-04 DIAGNOSIS — I10 Essential (primary) hypertension: Secondary | ICD-10-CM | POA: Diagnosis not present

## 2016-08-04 DIAGNOSIS — R69 Illness, unspecified: Secondary | ICD-10-CM | POA: Diagnosis not present

## 2016-08-04 HISTORY — DX: Benign neoplasm of right breast: D24.1

## 2016-08-04 HISTORY — PX: BREAST LUMPECTOMY WITH RADIOACTIVE SEED LOCALIZATION: SHX6424

## 2016-08-04 SURGERY — BREAST LUMPECTOMY WITH RADIOACTIVE SEED LOCALIZATION
Anesthesia: General | Site: Breast | Laterality: Right

## 2016-08-04 MED ORDER — CELECOXIB 200 MG PO CAPS
400.0000 mg | ORAL_CAPSULE | ORAL | Status: AC
  Administered 2016-08-04: 400 mg via ORAL
  Filled 2016-08-04: qty 2

## 2016-08-04 MED ORDER — ACETAMINOPHEN 500 MG PO TABS
1000.0000 mg | ORAL_TABLET | ORAL | Status: AC
  Administered 2016-08-04: 1000 mg via ORAL
  Filled 2016-08-04: qty 2

## 2016-08-04 MED ORDER — ONDANSETRON HCL 4 MG/2ML IJ SOLN
INTRAMUSCULAR | Status: AC
  Filled 2016-08-04: qty 2

## 2016-08-04 MED ORDER — FENTANYL CITRATE (PF) 100 MCG/2ML IJ SOLN
INTRAMUSCULAR | Status: DC | PRN
  Administered 2016-08-04 (×2): 25 ug via INTRAVENOUS
  Administered 2016-08-04: 50 ug via INTRAVENOUS

## 2016-08-04 MED ORDER — CHLORHEXIDINE GLUCONATE CLOTH 2 % EX PADS: 6.0000 | MEDICATED_PAD | Freq: Once | CUTANEOUS | Status: DC

## 2016-08-04 MED ORDER — PROPOFOL 10 MG/ML IV BOLUS
INTRAVENOUS | Status: DC | PRN
  Administered 2016-08-04: 120 mg via INTRAVENOUS

## 2016-08-04 MED ORDER — GABAPENTIN 300 MG PO CAPS
300.0000 mg | ORAL_CAPSULE | ORAL | Status: AC
  Administered 2016-08-04: 300 mg via ORAL
  Filled 2016-08-04: qty 1

## 2016-08-04 MED ORDER — MIDAZOLAM HCL 5 MG/5ML IJ SOLN
INTRAMUSCULAR | Status: DC | PRN
  Administered 2016-08-04: 2 mg via INTRAVENOUS

## 2016-08-04 MED ORDER — OXYCODONE HCL 5 MG PO TABS
ORAL_TABLET | ORAL | Status: AC
  Filled 2016-08-04: qty 1

## 2016-08-04 MED ORDER — HYDROCODONE-ACETAMINOPHEN 5-325 MG PO TABS: 1.0000 | ORAL_TABLET | Freq: Four times a day (QID) | ORAL | 0 refills | Status: DC | PRN

## 2016-08-04 MED ORDER — PROPOFOL 500 MG/50ML IV EMUL
INTRAVENOUS | Status: DC | PRN
  Administered 2016-08-04: 150 ug/kg/min via INTRAVENOUS

## 2016-08-04 MED ORDER — 0.9 % SODIUM CHLORIDE (POUR BTL) OPTIME
TOPICAL | Status: DC | PRN
  Administered 2016-08-04: 1000 mL

## 2016-08-04 MED ORDER — DEXAMETHASONE SODIUM PHOSPHATE 10 MG/ML IJ SOLN
INTRAMUSCULAR | Status: DC | PRN
  Administered 2016-08-04: 10 mg via INTRAVENOUS

## 2016-08-04 MED ORDER — BUPIVACAINE HCL (PF) 0.25 % IJ SOLN
INTRAMUSCULAR | Status: AC
  Filled 2016-08-04: qty 30

## 2016-08-04 MED ORDER — LIDOCAINE HCL (CARDIAC) 20 MG/ML IV SOLN
INTRAVENOUS | Status: DC | PRN
  Administered 2016-08-04: 40 mg via INTRAVENOUS

## 2016-08-04 MED ORDER — LIDOCAINE 2% (20 MG/ML) 5 ML SYRINGE
INTRAMUSCULAR | Status: AC
  Filled 2016-08-04: qty 5

## 2016-08-04 MED ORDER — LACTATED RINGERS IV SOLN
INTRAVENOUS | Status: DC | PRN
  Administered 2016-08-04: 08:00:00 via INTRAVENOUS

## 2016-08-04 MED ORDER — MIDAZOLAM HCL 2 MG/2ML IJ SOLN
INTRAMUSCULAR | Status: AC
  Filled 2016-08-04: qty 2

## 2016-08-04 MED ORDER — BUPIVACAINE HCL (PF) 0.25 % IJ SOLN
INTRAMUSCULAR | Status: DC | PRN
  Administered 2016-08-04: 10 mL

## 2016-08-04 MED ORDER — DEXAMETHASONE SODIUM PHOSPHATE 10 MG/ML IJ SOLN
INTRAMUSCULAR | Status: AC
  Filled 2016-08-04: qty 1

## 2016-08-04 MED ORDER — PROPOFOL 10 MG/ML IV BOLUS
INTRAVENOUS | Status: AC
  Filled 2016-08-04: qty 20

## 2016-08-04 MED ORDER — CEFAZOLIN SODIUM-DEXTROSE 2-4 GM/100ML-% IV SOLN
2.0000 g | INTRAVENOUS | Status: AC
  Administered 2016-08-04: 2 g via INTRAVENOUS
  Filled 2016-08-04: qty 100

## 2016-08-04 MED ORDER — FENTANYL CITRATE (PF) 250 MCG/5ML IJ SOLN
INTRAMUSCULAR | Status: AC
  Filled 2016-08-04: qty 5

## 2016-08-04 MED ORDER — OXYCODONE HCL 5 MG PO TABS
5.0000 mg | ORAL_TABLET | ORAL | Status: DC | PRN
  Administered 2016-08-04: 5 mg via ORAL

## 2016-08-04 MED ORDER — ONDANSETRON HCL 4 MG/2ML IJ SOLN
INTRAMUSCULAR | Status: DC | PRN
  Administered 2016-08-04: 4 mg via INTRAVENOUS

## 2016-08-04 SURGICAL SUPPLY — 46 items
ADH SKN CLS APL DERMABOND .7 (GAUZE/BANDAGES/DRESSINGS) ×1
APPLIER CLIP 9.375 MED OPEN (MISCELLANEOUS) ×2
APR CLP MED 9.3 20 MLT OPN (MISCELLANEOUS) ×1
BINDER BREAST LRG (GAUZE/BANDAGES/DRESSINGS) ×1 IMPLANT
BINDER BREAST XLRG (GAUZE/BANDAGES/DRESSINGS) IMPLANT
BLADE SURG 15 STRL LF DISP TIS (BLADE) ×1 IMPLANT
BLADE SURG 15 STRL SS (BLADE) ×2
CANISTER SUCT 3000ML PPV (MISCELLANEOUS) ×2 IMPLANT
CHLORAPREP W/TINT 26ML (MISCELLANEOUS) ×2 IMPLANT
CLIP APPLIE 9.375 MED OPEN (MISCELLANEOUS) ×1 IMPLANT
COVER PROBE W GEL 5X96 (DRAPES) ×2 IMPLANT
COVER SURGICAL LIGHT HANDLE (MISCELLANEOUS) ×2 IMPLANT
DERMABOND ADVANCED (GAUZE/BANDAGES/DRESSINGS) ×1
DERMABOND ADVANCED .7 DNX12 (GAUZE/BANDAGES/DRESSINGS) ×1 IMPLANT
DEVICE DUBIN SPECIMEN MAMMOGRA (MISCELLANEOUS) ×2 IMPLANT
DRAPE CHEST BREAST 15X10 FENES (DRAPES) ×2 IMPLANT
DRAPE UTILITY XL STRL (DRAPES) ×2 IMPLANT
DRSG PAD ABDOMINAL 8X10 ST (GAUZE/BANDAGES/DRESSINGS) ×3 IMPLANT
ELECT CAUTERY BLADE 6.4 (BLADE) ×3 IMPLANT
ELECT REM PT RETURN 9FT ADLT (ELECTROSURGICAL) ×2
ELECTRODE REM PT RTRN 9FT ADLT (ELECTROSURGICAL) ×1 IMPLANT
GAUZE SPONGE 4X4 12PLY STRL (GAUZE/BANDAGES/DRESSINGS) ×1 IMPLANT
GLOVE EUDERMIC 7 POWDERFREE (GLOVE) ×4 IMPLANT
GOWN STRL REUS W/ TWL LRG LVL3 (GOWN DISPOSABLE) ×1 IMPLANT
GOWN STRL REUS W/ TWL XL LVL3 (GOWN DISPOSABLE) ×1 IMPLANT
GOWN STRL REUS W/TWL LRG LVL3 (GOWN DISPOSABLE) ×2
GOWN STRL REUS W/TWL XL LVL3 (GOWN DISPOSABLE) ×2
ILLUMINATOR WAVEGUIDE N/F (MISCELLANEOUS) IMPLANT
KIT BASIN OR (CUSTOM PROCEDURE TRAY) ×2 IMPLANT
KIT MARKER MARGIN INK (KITS) ×2 IMPLANT
LIGHT WAVEGUIDE WIDE FLAT (MISCELLANEOUS) IMPLANT
NDL HYPO 25GX1X1/2 BEV (NEEDLE) ×1 IMPLANT
NEEDLE HYPO 25GX1X1/2 BEV (NEEDLE) ×2 IMPLANT
NS IRRIG 1000ML POUR BTL (IV SOLUTION) ×2 IMPLANT
PACK SURGICAL SETUP 50X90 (CUSTOM PROCEDURE TRAY) ×2 IMPLANT
PENCIL BUTTON HOLSTER BLD 10FT (ELECTRODE) ×3 IMPLANT
SPONGE LAP 4X18 X RAY DECT (DISPOSABLE) ×3 IMPLANT
SUT MNCRL AB 4-0 PS2 18 (SUTURE) ×2 IMPLANT
SUT SILK 2 0 SH (SUTURE) ×2 IMPLANT
SUT VIC AB 3-0 SH 18 (SUTURE) ×2 IMPLANT
SYR BULB 3OZ (MISCELLANEOUS) ×2 IMPLANT
SYR CONTROL 10ML LL (SYRINGE) ×2 IMPLANT
TOWEL OR 17X24 6PK STRL BLUE (TOWEL DISPOSABLE) ×2 IMPLANT
TOWEL OR 17X26 10 PK STRL BLUE (TOWEL DISPOSABLE) ×2 IMPLANT
TUBE CONNECTING 12X1/4 (SUCTIONS) ×2 IMPLANT
YANKAUER SUCT BULB TIP NO VENT (SUCTIONS) ×2 IMPLANT

## 2016-08-04 NOTE — Discharge Instructions (Signed)
[facility] °Office Phone Number [phone] ° °BREAST BIOPSY/ PARTIAL MASTECTOMY: POST OP INSTRUCTIONS ° °Always review your discharge instruction sheet given to you by the facility where your surgery was performed. ° °IF YOU HAVE DISABILITY OR FAMILY LEAVE FORMS, YOU MUST BRING THEM TO THE OFFICE FOR PROCESSING.  DO NOT GIVE THEM TO YOUR DOCTOR. ° °1. A prescription for pain medication may be given to you upon discharge.  Take your pain medication as prescribed, if needed.  If narcotic pain medicine is not needed, then you may take acetaminophen (Tylenol) or ibuprofen (Advil) as needed. °2. Take your usually prescribed medications unless otherwise directed °3. If you need a refill on your pain medication, please contact your pharmacy.  They will contact our office to request authorization.  Prescriptions will not be filled after 5pm or on week-ends. °4. You should eat very light the first 24 hours after surgery, such as soup, crackers, pudding, etc.  Resume your normal diet the day after surgery. °5. Most patients will experience some swelling and bruising in the breast.  Ice packs and a good support bra will help.  Swelling and bruising can take several days to resolve.  °6. It is common to experience some constipation if taking pain medication after surgery.  Increasing fluid intake and taking a stool softener will usually help or prevent this problem from occurring.  A mild laxative (Milk of Magnesia or Miralax) should be taken according to package directions if there are no bowel movements after 48 hours. °7. Unless discharge instructions indicate otherwise, you may remove your bandages 24-48 hours after surgery, and you may shower at that time.  You may have steri-strips (small skin tapes) in place directly over the incision.  These strips should be left on the skin for 7-10 days.  If your surgeon used skin glue on the incision, you may shower in 24 hours.  The glue will flake off over the  next 2-3 weeks.  Any sutures or staples will be removed at the office during your follow-up visit. °8. ACTIVITIES:  You may resume regular daily activities (gradually increasing) beginning the next day.  Wearing a good support bra or sports bra minimizes pain and swelling.  You may have sexual intercourse when it is comfortable. °a. You may drive when you no longer are taking prescription pain medication, you can comfortably wear a seatbelt, and you can safely maneuver your car and apply brakes. °b. RETURN TO WORK:  ______________________________________________________________________________________ °9. You should see your doctor in the office for a follow-up appointment approximately two weeks after your surgery.  Your doctor’s nurse will typically make your follow-up appointment when she calls you with your pathology report.  Expect your pathology report 2-3 business days after your surgery.  You may call to check if you do not hear from us after three days. °10. OTHER INSTRUCTIONS: _______________________________________________________________________________________________ _____________________________________________________________________________________________________________________________________ °_____________________________________________________________________________________________________________________________________ °_____________________________________________________________________________________________________________________________________ ° °WHEN TO CALL YOUR DOCTOR: °1. Fever over 101.0 °2. Nausea and/or vomiting. °3. Extreme swelling or bruising. °4. Continued bleeding from incision. °5. Increased pain, redness, or drainage from the incision. ° °The clinic staff is available to answer your questions during regular business hours.  Please don’t hesitate to call and ask to speak to one of the nurses for clinical concerns.  If you have a medical emergency, go to the nearest  emergency room or call 911.  A surgeon from [facility] is always on call at the hospital. ° °For further questions, please visit [email or web address]  °

## 2016-08-04 NOTE — Anesthesia Preprocedure Evaluation (Addendum)
Anesthesia Evaluation  Patient identified by MRN, date of birth, ID band Patient awake    Reviewed: Allergy & Precautions, H&P , Patient's Chart, lab work & pertinent test results, reviewed documented beta blocker date and time   Airway Mallampati: II  TM Distance: >3 FB Neck ROM: full    Dental no notable dental hx.    Pulmonary    Pulmonary exam normal breath sounds clear to auscultation       Cardiovascular hypertension,  Rhythm:regular Rate:Normal     Neuro/Psych    GI/Hepatic   Endo/Other    Renal/GU      Musculoskeletal   Abdominal   Peds  Hematology   Anesthesia Other Findings   Reproductive/Obstetrics                             Anesthesia Physical Anesthesia Plan  ASA: II  Anesthesia Plan: General   Post-op Pain Management:    Induction: Intravenous  Airway Management Planned: LMA  Additional Equipment:   Intra-op Plan:   Post-operative Plan:   Informed Consent: I have reviewed the patients History and Physical, chart, labs and discussed the procedure including the risks, benefits and alternatives for the proposed anesthesia with the patient or authorized representative who has indicated his/her understanding and acceptance.   Dental Advisory Given and Dental advisory given  Plan Discussed with: CRNA and Surgeon  Anesthesia Plan Comments: ( Will use TIVA for PONV)       Anesthesia Quick Evaluation

## 2016-08-04 NOTE — Anesthesia Procedure Notes (Signed)
Procedure Name: LMA Insertion Date/Time: 08/04/2016 8:35 AM Performed by: Sampson Si E Pre-anesthesia Checklist: Patient identified, Emergency Drugs available, Suction available and Patient being monitored Patient Re-evaluated:Patient Re-evaluated prior to inductionOxygen Delivery Method: Circle System Utilized Preoxygenation: Pre-oxygenation with 100% oxygen Intubation Type: IV induction Ventilation: Mask ventilation without difficulty LMA: LMA inserted LMA Size: 4.0 Number of attempts: 1 Placement Confirmation: positive ETCO2 Tube secured with: Tape Dental Injury: Teeth and Oropharynx as per pre-operative assessment

## 2016-08-04 NOTE — Interval H&P Note (Signed)
History and Physical Interval Note:  08/04/2016 7:34 AM  [name]  has presented today for surgery, with the diagnosis of RIGHT BREAST PAPILLOMA  The various methods of treatment have been discussed with the patient and family. After consideration of risks, benefits and other options for treatment, the patient has consented to  Procedure(s): RIGHT BREAST LUMPECTOMY WITH RADIOACTIVE SEED LOCALIZATION (Right) as a surgical intervention .  The patient's history has been reviewed, patient examined, no change in status, stable for surgery.  I have reviewed the patient's chart and labs.  Questions were answered to the patient's satisfaction.     Adin Hector

## 2016-08-04 NOTE — Op Note (Addendum)
Patient Name:           [name]   Date of Surgery:        08/04/2016  Pre op Diagnosis:      Papilloma right breast, lower outer quadrant  Post op Diagnosis:    Same  Procedure:                 Right breast lumpectomy with radioactive seed localization, margin assessment  Surgeon:                     Edsel Petrin. Dalbert Batman, M.D., FACS  Assistant:                      OR staff   Indication for Assistant: n/a  Operative Indications:    This is a very pleasant 66 year old female from [place] , [place]. She is referred by Dr. Pamelia Hoit at the [facility] for evaluation and excision of a papilloma in the right breast, lower outer quadrant. Her PCP is Dr. Threasa Alpha in [facility]      She has no prior history of breast problems. She gets annual mammograms. Recent mammograms show a 4 mm area of calcifications in the right breast, lower outer quadrant. Middle depth. No mass effect. Category B density. Pathology from image guided biopsy shows papilloma with sclerosis, some metaplasia and calcifications. No atypia..       Family history reveals mother died of metastatic breast cancer age 105. Paternal aunt survived breast cancer. Maternal aunt had some type of gynecologic cancer and survived.      I told her that she probably does not have cancer, but that given the diagnosis of papilloma coupled with a first degree relative that died of metastatic breast cancer that her risk was a little bit too high to simply watch this. She fully agrees. She'll be scheduled for right breast lumpectomy with radioactive seed localization. She agrees with this plan.  Operative Findings:       The radioactive seed and the original titanium marker clip were immediately adjacent to each other in the lower outer quadrant of the right breast.  I could hear the audible signal of the radioactive seed both preop and intraoperative.  The specimen mammogram looked good and contained of  the radioactive seed and the marker clip.  There was no gross palpable abnormality  Procedure in Detail:          Following the induction of general LMA anesthesia the patient's right breast was prepped and draped in a sterile fashion.  Surgical timeout was performed.  Intravenous antibiotics were given.  0.5% Marcaine was used as local infiltration anesthetic.  Using the neoprobe as a guide, I fashioned a radially oriented incision in the right breast, lower outer quadrant.  This incision was made with a knife.  The lumpectomy was performed using electrocautery and using the neoprobe frequently.  The specimen was removed and marked with silk sutures and a 6 color ink kit to orient the pathologist.  Specimen mammogram looked good as described above.  The specimen was marked and sent to the lab.  Hemostasis was excellent and achieved with electrocautery.  The wound was copiously irrigated.  The margins of the lumpectomy cavity were marked with 5 metallic clips according to our protocol.  The breast tissues were closed in layers with interrupted 3-0 Vicryl and the skin closed with a running subcuticular 4-0 Monocryl and Dermabond.  Breast binder was placed in the patient taken to PACU in stable condition.  EBL 20 mL or less.  Counts correct.  Complications none.    Addendum: The patient was given a prescription for Norco for pain.  I logged on to the [employer] and reviewed her prescription medication history     Edsel Petrin. Dalbert Batman, M.D., FACS General and Minimally Invasive Surgery Breast and Colorectal Surgery  08/04/2016 9:33 AM

## 2016-08-04 NOTE — Transfer of Care (Signed)
Immediate Anesthesia Transfer of Care Note  Patient: [name]  Procedure(s) Performed: Procedure(s): RIGHT BREAST LUMPECTOMY WITH RADIOACTIVE SEED LOCALIZATION (Right)  Patient Location: PACU  Anesthesia Type:General  Level of Consciousness: awake and patient cooperative  Airway & Oxygen Therapy: Patient Spontanous Breathing and Patient connected to nasal cannula oxygen  Post-op Assessment: Report given to RN  Post vital signs: Reviewed and stable  Last Vitals:  Vitals:   08/04/16 0744  BP: (!) 150/78  Pulse: (!) 58  Resp: 20  Temp: 36.8 C    Last Pain:  Vitals:   08/04/16 0744  TempSrc: Oral      Patients Stated Pain Goal: 3 (09/25/31 5825)  Complications: No apparent anesthesia complications

## 2016-08-05 ENCOUNTER — Encounter (HOSPITAL_COMMUNITY): Payer: Self-pay | Admitting: General Surgery

## 2016-08-07 NOTE — Progress Notes (Signed)
Inform patient of Pathology report,. Path shows benign papilloma. No atypia and no cancer. I will discuss in detail at next OV.  hmi

## 2016-08-07 NOTE — Anesthesia Postprocedure Evaluation (Signed)
Anesthesia Post Note  Patient: [name]  Procedure(s) Performed: Procedure(s) (LRB): RIGHT BREAST LUMPECTOMY WITH RADIOACTIVE SEED LOCALIZATION (Right)  Patient location during evaluation: PACU Anesthesia Type: General Level of consciousness: awake and alert Pain management: pain level controlled Vital Signs Assessment: post-procedure vital signs reviewed and stable Respiratory status: spontaneous breathing, nonlabored ventilation, respiratory function stable and patient connected to nasal cannula oxygen Cardiovascular status: blood pressure returned to baseline and stable Postop Assessment: no signs of nausea or vomiting Anesthetic complications: no        Last Vitals:  Vitals:   08/04/16 1045 08/04/16 1051  BP:  105/67  Pulse: (!) 56 (!) 58  Resp: 18 (!) 21  Temp: 36.1 C 36.1 C    Last Pain:  Vitals:   08/04/16 1051  TempSrc:   PainSc: 0-No pain   Pain Goal: Patients Stated Pain Goal: 3 (08/04/16 0734)               Lyndle Herrlich EDWARD

## 2016-08-18 ENCOUNTER — Encounter: Payer: Self-pay | Admitting: Obstetrics and Gynecology

## 2016-11-02 ENCOUNTER — Encounter: Payer: Self-pay | Admitting: Obstetrics and Gynecology

## 2016-11-19 NOTE — Anesthesia Postprocedure Evaluation (Signed)
Anesthesia Post Note  Patient: [name]  Procedure(s) Performed: Procedure(s) (LRB): RIGHT BREAST LUMPECTOMY WITH RADIOACTIVE SEED LOCALIZATION (Right)     Anesthesia Post Evaluation  Last Vitals:  Vitals:   08/04/16 1045 08/04/16 1051  BP:  105/67  Pulse: (!) 56 (!) 58  Resp: 18 (!) 21  Temp: 36.1 C 36.1 C    Last Pain:  Vitals:   08/04/16 1051  TempSrc:   PainSc: 0-No pain                 Rane Blitch EDWARD

## 2016-11-19 NOTE — Addendum Note (Signed)
Addendum  created 11/19/16 1344 by Lyndle Herrlich, MD   Sign clinical note

## 2016-12-08 ENCOUNTER — Encounter: Payer: Self-pay | Admitting: Obstetrics and Gynecology

## 2017-05-19 DIAGNOSIS — M545 Low back pain: Secondary | ICD-10-CM | POA: Diagnosis not present

## 2017-05-19 DIAGNOSIS — E559 Vitamin D deficiency, unspecified: Secondary | ICD-10-CM | POA: Diagnosis not present

## 2017-05-19 DIAGNOSIS — E785 Hyperlipidemia, unspecified: Secondary | ICD-10-CM | POA: Diagnosis not present

## 2017-05-19 DIAGNOSIS — M542 Cervicalgia: Secondary | ICD-10-CM | POA: Diagnosis not present

## 2017-05-19 DIAGNOSIS — K219 Gastro-esophageal reflux disease without esophagitis: Secondary | ICD-10-CM | POA: Diagnosis not present

## 2017-05-19 DIAGNOSIS — R0989 Other specified symptoms and signs involving the circulatory and respiratory systems: Secondary | ICD-10-CM | POA: Diagnosis not present

## 2017-05-19 DIAGNOSIS — I10 Essential (primary) hypertension: Secondary | ICD-10-CM | POA: Diagnosis not present

## 2017-05-19 DIAGNOSIS — R69 Illness, unspecified: Secondary | ICD-10-CM | POA: Diagnosis not present

## 2017-05-19 DIAGNOSIS — E7800 Pure hypercholesterolemia, unspecified: Secondary | ICD-10-CM | POA: Diagnosis not present

## 2017-05-21 DIAGNOSIS — R69 Illness, unspecified: Secondary | ICD-10-CM | POA: Diagnosis not present

## 2017-06-12 ENCOUNTER — Other Ambulatory Visit: Payer: Self-pay

## 2017-06-12 ENCOUNTER — Ambulatory Visit (HOSPITAL_COMMUNITY)
Admission: EM | Admit: 2017-06-12 | Discharge: 2017-06-12 | Disposition: A | Discharge status: Home / Self Care | Payer: Medicare HMO | Attending: Family Medicine | Admitting: Family Medicine

## 2017-06-12 ENCOUNTER — Encounter (HOSPITAL_COMMUNITY): Payer: Self-pay | Admitting: *Deleted

## 2017-06-12 DIAGNOSIS — H10013 Acute follicular conjunctivitis, bilateral: Secondary | ICD-10-CM | POA: Diagnosis not present

## 2017-06-12 MED ORDER — OLOPATADINE HCL 0.1 % OP SOLN: 1.0000 [drp] | Freq: Two times a day (BID) | OPHTHALMIC | 12 refills | Status: AC

## 2017-06-12 NOTE — ED Provider Notes (Signed)
[place]   [id number] 06/12/17 Arrival Time: [place]   SUBJECTIVE:  [name] is a 67 y.o. female who presents to the urgent care with complaint of nasal congestion, sinus pressure, bilat eye irritation and burning without vision change starting 5 days ago.  Has been using OTC eye oint.  C/O swelling around eyes, HA.   Past Medical History:  Diagnosis Date  . Anxiety   . Arthritis    oa  . Complication of anesthesia   . Depression   . GERD (gastroesophageal reflux disease)   . Hypertension   . Papilloma of right breast 08/04/2016   Family History  Problem Relation Age of Onset  . Breast cancer Mother 61  . Breast cancer Paternal Aunt    Social History   Socioeconomic History  . Marital status: Married    Spouse name: Not on file  . Number of children: Not on file  . Years of education: Not on file  . Highest education level: Not on file  Social Needs  . Financial resource strain: Not on file  . Food insecurity - worry: Not on file  . Food insecurity - inability: Not on file  . Transportation needs - medical: Not on file  . Transportation needs - non-medical: Not on file  Occupational History  . Not on file  Tobacco Use  . Smoking status: Never Smoker  . Smokeless tobacco: Never Used  Substance and Sexual Activity  . Alcohol use: No  . Drug use: No  . Sexual activity: Not on file  Other Topics Concern  . Not on file  Social History Narrative  . Not on file   Current Meds  Medication Sig  . aspirin 325 MG tablet Take 325 mg by mouth at bedtime.  . Cholecalciferol (VITAMIN D3) 5000 units TABS Take by mouth at bedtime.  . metoprolol (LOPRESSOR) 50 MG tablet Take 50 mg by mouth 2 (two) times daily.  . Multiple Vitamin (MULTIVITAMIN WITH MINERALS) TABS tablet Take 1 tablet by mouth daily. Centrum Silver  . Omega-3 Fatty Acids (FISH OIL) 1200 MG CAPS Take 1,200 mg by mouth 2 (two) times daily.  . pantoprazole (PROTONIX) 40 MG tablet Take 40 mg by  mouth 2 (two) times daily.   . sertraline (ZOLOFT) 100 MG tablet Take 200 mg by mouth daily.  . SUPER B COMPLEX/C PO Take 1 tablet by mouth daily.  . traZODone (DESYREL) 150 MG tablet Take 300 mg by mouth at bedtime.   . Turmeric 500 MG CAPS Take 500 mg by mouth 2 (two) times daily.  . vitamin E 400 UNIT capsule Take 400 Units by mouth at bedtime.   Allergies  Allergen Reactions  . Erythromycin Other (See Comments)    Severe abdominal pain.  . Valium [Diazepam] Other (See Comments)    Mood disturbances/combative behaviors.  . Venlafaxine Rash      ROS: As per HPI, remainder of ROS negative.   OBJECTIVE:   Vitals:   06/12/17 1401  BP: (!) 157/71  Pulse: (!) 55  Resp: 14  Temp: 97.8 F (36.6 C)  TempSrc: Oral  SpO2: 100%     General appearance: alert; no distress Eyes: PERRL; EOMI; conjunctiva mildly injected diffusely, especially at lid margins HENT: normocephalic; atraumatic;  Neck: supple Back: no CVA tenderness Extremities: no cyanosis or edema; symmetrical with no gross deformities Skin: warm and dry Neurologic: normal gait; grossly normal Psychological: alert and cooperative; normal mood and affect  Labs:  Results for orders placed or performed during the hospital encounter of 07/29/16  CBC WITH DIFFERENTIAL  Result Value Ref Range   WBC 5.8 4.0 - 10.5 K/uL   RBC 4.45 3.87 - 5.11 MIL/uL   Hemoglobin 13.6 12.0 - 15.0 g/dL   HCT 40.8 36.0 - 46.0 %   MCV 91.7 78.0 - 100.0 fL   MCH 30.6 26.0 - 34.0 pg   MCHC 33.3 30.0 - 36.0 g/dL   RDW 13.0 11.5 - 15.5 %   Platelets 185 150 - 400 K/uL   Neutrophils Relative % 68 %   Neutro Abs 4.0 1.7 - 7.7 K/uL   Lymphocytes Relative 21 %   Lymphs Abs 1.2 0.7 - 4.0 K/uL   Monocytes Relative 8 %   Monocytes Absolute 0.5 0.1 - 1.0 K/uL   Eosinophils Relative 2 %   Eosinophils Absolute 0.1 0.0 - 0.7 K/uL   Basophils Relative 1 %   Basophils Absolute 0.0 0.0 - 0.1 K/uL  Comprehensive metabolic panel  Result  Value Ref Range   Sodium 140 135 - 145 mmol/L   Potassium 4.2 3.5 - 5.1 mmol/L   Chloride 105 101 - 111 mmol/L   CO2 26 22 - 32 mmol/L   Glucose, Bld 104 (H) 65 - 99 mg/dL   BUN 28 (H) 6 - 20 mg/dL   Creatinine, Ser 1.49 (H) 0.44 - 1.00 mg/dL   Calcium 9.9 8.9 - 10.3 mg/dL   Total Protein 7.0 6.5 - 8.1 g/dL   Albumin 4.2 3.5 - 5.0 g/dL   AST 31 15 - 41 U/L   ALT 25 14 - 54 U/L   Alkaline Phosphatase 54 38 - 126 U/L   Total Bilirubin 0.7 0.3 - 1.2 mg/dL   GFR calc non Af Amer 35 (L) >60 mL/min   GFR calc Af Amer 41 (L) >60 mL/min   Anion gap 9 5 - 15    Labs Reviewed - No data to display  No results found.     ASSESSMENT & PLAN:  1. Acute follicular conjunctivitis of both eyes     Meds ordered this encounter  Medications  . olopatadine (PATANOL) 0.1 % ophthalmic solution    Sig: Place 1 drop into both eyes 2 (two) times daily.    Dispense:  5 mL    Refill:  12    Reviewed expectations re: course of current medical issues. Questions answered. Outlined signs and symptoms indicating need for more acute intervention. Patient verbalized understanding. After Visit Summary given.    Procedures:      Robyn Haber, MD 06/12/17 1421

## 2017-06-12 NOTE — ED Triage Notes (Signed)
C/O nasal congestion, sinus pressure, bilat eye irritation and burning without vision change starting 5 days ago.  Has been using OTC eye oint.  C/O swelling around eyes, HA.

## 2017-10-09 ENCOUNTER — Encounter (HOSPITAL_COMMUNITY): Payer: Self-pay | Admitting: Emergency Medicine

## 2017-10-09 ENCOUNTER — Ambulatory Visit (HOSPITAL_COMMUNITY)
Admission: EM | Admit: 2017-10-09 | Discharge: 2017-10-09 | Disposition: A | Discharge status: Home / Self Care | Payer: Medicare HMO | Attending: Family Medicine | Admitting: Family Medicine

## 2017-10-09 DIAGNOSIS — Z881 Allergy status to other antibiotic agents status: Secondary | ICD-10-CM | POA: Insufficient documentation

## 2017-10-09 DIAGNOSIS — Z7982 Long term (current) use of aspirin: Secondary | ICD-10-CM | POA: Diagnosis not present

## 2017-10-09 DIAGNOSIS — K219 Gastro-esophageal reflux disease without esophagitis: Secondary | ICD-10-CM | POA: Insufficient documentation

## 2017-10-09 DIAGNOSIS — R300 Dysuria: Secondary | ICD-10-CM | POA: Diagnosis not present

## 2017-10-09 DIAGNOSIS — Z79899 Other long term (current) drug therapy: Secondary | ICD-10-CM | POA: Diagnosis not present

## 2017-10-09 DIAGNOSIS — I10 Essential (primary) hypertension: Secondary | ICD-10-CM | POA: Diagnosis not present

## 2017-10-09 LAB — POCT URINALYSIS DIP (DEVICE)
BILIRUBIN URINE: NEGATIVE
Glucose, UA: NEGATIVE mg/dL
KETONES UR: NEGATIVE mg/dL
Nitrite: NEGATIVE
Protein, ur: NEGATIVE mg/dL
Specific Gravity, Urine: 1.015 (ref 1.005–1.030)
Urobilinogen, UA: 0.2 mg/dL (ref 0.0–1.0)
pH: 5.5 (ref 5.0–8.0)

## 2017-10-09 MED ORDER — NITROFURANTOIN MONOHYD MACRO 100 MG PO CAPS: 100.0000 mg | ORAL_CAPSULE | Freq: Two times a day (BID) | ORAL | 0 refills | Status: AC

## 2017-10-09 MED ORDER — FLUCONAZOLE 150 MG PO TABS: 150.0000 mg | ORAL_TABLET | Freq: Once | ORAL | 0 refills | Status: AC

## 2017-10-09 NOTE — ED Triage Notes (Signed)
Pt c/o urinary frequeny, dysuria x1 week.

## 2017-10-09 NOTE — Discharge Instructions (Signed)
Urine showed evidence of infection. We are treating you with macrobid. Be sure to take full course. Stay hydrated- urine should be pale yellow to clear. My continue azo for relief of burning while infection is being cleared.  ° °Please return or follow up with your primary provider if symptoms not improving with treatment. Please return sooner if you have worsening of symptoms or develop fever, nausea, vomiting, abdominal pain, back pain, lightheadedness, dizziness. °

## 2017-10-09 NOTE — ED Provider Notes (Signed)
[place]    CSN: [id number] Arrival date & time: 10/09/17  1200     History   Chief Complaint Chief Complaint  Patient presents with  . Dysuria    HPI [name] is a 67 y.o. female history of hypertension, GERD; Patient is presenting with symptoms of dysuria, increased frequency, incomplete voiding, abdominal pressure. Denies hematuria, vaginal discharge. Denies fever, nausea, vomiting, flank/back pain.  States she has a history of UTIs, last one was a few months ago.  Also states she is prone to getting yeast infections.  Has mild itching and irritation.   HPI  Past Medical History:  Diagnosis Date  . Anxiety   . Arthritis    oa  . Complication of anesthesia   . Depression   . GERD (gastroesophageal reflux disease)   . Hypertension   . Papilloma of right breast 08/04/2016    Patient Active Problem List   Diagnosis Date Noted  . Papilloma of right breast 08/04/2016    Past Surgical History:  Procedure Laterality Date  . ABDOMINAL HYSTERECTOMY    . APPENDECTOMY    . BREAST BIOPSY Right 05/11/2016   right stereo for calcs  . BREAST LUMPECTOMY WITH RADIOACTIVE SEED LOCALIZATION Right 08/04/2016   Procedure: RIGHT BREAST LUMPECTOMY WITH RADIOACTIVE SEED LOCALIZATION;  Surgeon: Fanny Skates, MD;  Location: [place];  Service: General;  Laterality: Right;  . CHOLECYSTECTOMY      OB History   None      Home Medications    Prior to Admission medications   Medication Sig Start Date End Date Taking? Authorizing Provider  aspirin 325 MG tablet Take 325 mg by mouth at bedtime.    [provider]  Cholecalciferol (VITAMIN D3) 5000 units TABS Take by mouth at bedtime.    [provider]  HYDROcodone-acetaminophen (NORCO) 5-325 MG tablet Take 1-2 tablets by mouth every 6 (six) hours as needed for moderate pain or severe pain. 08/04/16   Fanny Skates, MD  ibuprofen (ADVIL,MOTRIN) 200 MG tablet Take 400-800 mg by mouth every 8 (eight)  hours as needed (for pain/headaches.).    [provider]  metoprolol (LOPRESSOR) 50 MG tablet Take 50 mg by mouth 2 (two) times daily.    [provider]  Multiple Vitamin (MULTIVITAMIN WITH MINERALS) TABS tablet Take 1 tablet by mouth daily. Centrum Silver    [provider]  nitrofurantoin, macrocrystal-monohydrate, (MACROBID) 100 MG capsule Take 1 capsule (100 mg total) by mouth 2 (two) times daily for 5 days. 10/09/17 10/14/17  Wieters, Hallie C, PA-C  olopatadine (PATANOL) 0.1 % ophthalmic solution Place 1 drop into both eyes 2 (two) times daily. 06/12/17   Robyn Haber, MD  Omega-3 Fatty Acids (FISH OIL) 1200 MG CAPS Take 1,200 mg by mouth 2 (two) times daily.    [provider]  pantoprazole (PROTONIX) 40 MG tablet Take 40 mg by mouth 2 (two) times daily.     [provider]  sertraline (ZOLOFT) 100 MG tablet Take 200 mg by mouth daily.    [provider]  SUPER B COMPLEX/C PO Take 1 tablet by mouth daily.    [provider]  traZODone (DESYREL) 150 MG tablet Take 300 mg by mouth at bedtime.     [provider]  Turmeric 500 MG CAPS Take 500 mg by mouth 2 (two) times daily.    [provider]  vitamin E 400 UNIT capsule Take 400 Units by mouth at bedtime.  [provider]    Family History Family History  Problem Relation Age of Onset  . Breast cancer Mother 27  . Breast cancer Paternal Aunt     Social History Social History   Tobacco Use  . Smoking status: Never Smoker  . Smokeless tobacco: Never Used  Substance Use Topics  . Alcohol use: No  . Drug use: No     Allergies   Erythromycin; Valium [diazepam]; and Venlafaxine   Review of Systems Review of Systems  Constitutional: Negative for fever.  Respiratory: Negative for shortness of breath.   Cardiovascular: Negative for chest pain.  Gastrointestinal: Negative for abdominal pain, diarrhea, nausea and vomiting.  Genitourinary:  Positive for dysuria and frequency. Negative for flank pain, genital sores, hematuria, menstrual problem, vaginal bleeding, vaginal discharge and vaginal pain.  Musculoskeletal: Positive for back pain and myalgias.  Skin: Negative for rash.  Neurological: Negative for dizziness, light-headedness and headaches.     Physical Exam Triage Vital Signs ED Triage Vitals [10/09/17 1209]  Enc Vitals Group     BP 124/81     Pulse Rate 64     Resp 18     Temp 98 F (36.7 C)     Temp src      SpO2 97 %     Weight      Height      Head Circumference      Peak Flow      Pain Score      Pain Loc      Pain Edu?      Excl. in [place]?    No data found.  Updated Vital Signs BP 124/81   Pulse 64   Temp 98 F (36.7 C)   Resp 18   SpO2 97%   Visual Acuity Right Eye Distance:   Left Eye Distance:   Bilateral Distance:    Right Eye Near:   Left Eye Near:    Bilateral Near:     Physical Exam  Constitutional: She appears well-developed and well-nourished. No distress.  HENT:  Head: Normocephalic and atraumatic.  Eyes: Conjunctivae are normal.  Neck: Neck supple.  Cardiovascular: Normal rate and regular rhythm.  No murmur heard. Pulmonary/Chest: Effort normal and breath sounds normal. No respiratory distress.  Abdominal: Soft.  Mild tenderness diffusely throughout abdomen, negative CVA tenderness  Musculoskeletal: She exhibits no edema.  Neurological: She is alert.  Skin: Skin is warm and dry.  Psychiatric: She has a normal mood and affect.  Nursing note and vitals reviewed.    UC Treatments / Results  Labs (all labs ordered are listed, but only abnormal results are displayed) Labs Reviewed  POCT URINALYSIS DIP (DEVICE) - Abnormal; Notable for the following components:      Result Value   Hgb urine dipstick MODERATE (*)    Leukocytes, UA LARGE (*)    All other components within normal limits  URINE CULTURE    EKG None  Radiology No results  found.  Procedures Procedures (including critical care time)  Medications Ordered in UC Medications - No data to display  Initial Impression / Assessment and Plan / UC Course  I have reviewed the triage vital signs and the nursing notes.  Pertinent labs & imaging results that were available during my care of the patient were reviewed by me and considered in my medical decision making (see chart for details).    UA showing large leukocytes. Will treat for UTI with Macrobid. Urine culture ordered, will call  if need for any change in treatment. Patient without fever, nausea, vomiting, flank pain/CVA tenderness, unlikely pyelo.  Also provided Diflucan to treat yeast infection development after antibiotic use.  Discussed strict return precautions. Patient verbalized understanding and is agreeable with plan.   Final Clinical Impressions(s) / UC Diagnoses   Final diagnoses:  Dysuria     Discharge Instructions     Urine showed evidence of infection. We are treating you with macrobid. Be sure to take full course. Stay hydrated- urine should be pale yellow to clear. My continue azo for relief of burning while infection is being cleared.   Please return or follow up with your primary provider if symptoms not improving with treatment. Please return sooner if you have worsening of symptoms or develop fever, nausea, vomiting, abdominal pain, back pain, lightheadedness, dizziness.   ED Prescriptions    Medication Sig Dispense Auth. Provider   nitrofurantoin, macrocrystal-monohydrate, (MACROBID) 100 MG capsule Take 1 capsule (100 mg total) by mouth 2 (two) times daily for 5 days. 10 capsule Wieters, Hallie C, PA-C     Controlled Substance Prescriptions [place] Controlled Substance Registry consulted? Not Applicable   Janith Lima, [place] 10/09/17 1226

## 2017-10-11 LAB — URINE CULTURE: Culture: 60000 — AB

## 2017-10-12 ENCOUNTER — Telehealth (HOSPITAL_COMMUNITY): Payer: Self-pay

## 2017-10-12 NOTE — Telephone Encounter (Signed)
Urine culture was positive for E.Coli and was given Macrobid at urgent care visit. Attempted to reach patient. No answer at this time. Voicemail left.

## 2017-11-30 DIAGNOSIS — E7800 Pure hypercholesterolemia, unspecified: Secondary | ICD-10-CM | POA: Diagnosis not present

## 2017-11-30 DIAGNOSIS — K219 Gastro-esophageal reflux disease without esophagitis: Secondary | ICD-10-CM | POA: Diagnosis not present

## 2017-11-30 DIAGNOSIS — E559 Vitamin D deficiency, unspecified: Secondary | ICD-10-CM | POA: Diagnosis not present

## 2017-11-30 DIAGNOSIS — E785 Hyperlipidemia, unspecified: Secondary | ICD-10-CM | POA: Diagnosis not present

## 2017-11-30 DIAGNOSIS — R69 Illness, unspecified: Secondary | ICD-10-CM | POA: Diagnosis not present

## 2018-02-11 DIAGNOSIS — R69 Illness, unspecified: Secondary | ICD-10-CM | POA: Diagnosis not present

## 2018-04-22 DIAGNOSIS — J309 Allergic rhinitis, unspecified: Secondary | ICD-10-CM | POA: Diagnosis not present

## 2018-04-22 DIAGNOSIS — M1990 Unspecified osteoarthritis, unspecified site: Secondary | ICD-10-CM | POA: Diagnosis not present

## 2018-04-22 DIAGNOSIS — R69 Illness, unspecified: Secondary | ICD-10-CM | POA: Diagnosis not present

## 2018-04-22 DIAGNOSIS — I10 Essential (primary) hypertension: Secondary | ICD-10-CM | POA: Diagnosis not present

## 2018-04-22 DIAGNOSIS — K219 Gastro-esophageal reflux disease without esophagitis: Secondary | ICD-10-CM | POA: Diagnosis not present

## 2018-04-22 DIAGNOSIS — E785 Hyperlipidemia, unspecified: Secondary | ICD-10-CM | POA: Diagnosis not present

## 2018-04-22 DIAGNOSIS — G4700 Insomnia, unspecified: Secondary | ICD-10-CM | POA: Diagnosis not present

## 2018-04-22 DIAGNOSIS — I471 Supraventricular tachycardia: Secondary | ICD-10-CM | POA: Diagnosis not present

## 2018-04-22 DIAGNOSIS — G8929 Other chronic pain: Secondary | ICD-10-CM | POA: Diagnosis not present

## 2018-05-19 ENCOUNTER — Emergency Department (HOSPITAL_COMMUNITY): Payer: Medicare HMO

## 2018-05-19 ENCOUNTER — Encounter (HOSPITAL_COMMUNITY): Payer: Self-pay

## 2018-05-19 ENCOUNTER — Ambulatory Visit (HOSPITAL_COMMUNITY)
Admission: EM | Admit: 2018-05-19 | Discharge: 2018-05-19 | Disposition: A | Discharge status: Home / Self Care | Payer: Medicare HMO | Source: Home / Self Care

## 2018-05-19 ENCOUNTER — Emergency Department (HOSPITAL_COMMUNITY)
Admission: EM | Admit: 2018-05-19 | Discharge: 2018-05-19 | Disposition: A | Discharge status: Home / Self Care | Payer: Medicare HMO | Attending: Emergency Medicine | Admitting: Emergency Medicine

## 2018-05-19 ENCOUNTER — Other Ambulatory Visit: Payer: Self-pay

## 2018-05-19 DIAGNOSIS — Y939 Activity, unspecified: Secondary | ICD-10-CM | POA: Diagnosis not present

## 2018-05-19 DIAGNOSIS — W19XXXA Unspecified fall, initial encounter: Secondary | ICD-10-CM

## 2018-05-19 DIAGNOSIS — S199XXA Unspecified injury of neck, initial encounter: Secondary | ICD-10-CM | POA: Diagnosis not present

## 2018-05-19 DIAGNOSIS — I10 Essential (primary) hypertension: Secondary | ICD-10-CM | POA: Diagnosis not present

## 2018-05-19 DIAGNOSIS — M25532 Pain in left wrist: Secondary | ICD-10-CM | POA: Diagnosis not present

## 2018-05-19 DIAGNOSIS — Y998 Other external cause status: Secondary | ICD-10-CM | POA: Diagnosis not present

## 2018-05-19 DIAGNOSIS — S6992XA Unspecified injury of left wrist, hand and finger(s), initial encounter: Secondary | ICD-10-CM | POA: Diagnosis not present

## 2018-05-19 DIAGNOSIS — Y92009 Unspecified place in unspecified non-institutional (private) residence as the place of occurrence of the external cause: Secondary | ICD-10-CM | POA: Insufficient documentation

## 2018-05-19 DIAGNOSIS — Z7982 Long term (current) use of aspirin: Secondary | ICD-10-CM | POA: Insufficient documentation

## 2018-05-19 DIAGNOSIS — S0181XA Laceration without foreign body of other part of head, initial encounter: Secondary | ICD-10-CM | POA: Diagnosis not present

## 2018-05-19 DIAGNOSIS — S0101XA Laceration without foreign body of scalp, initial encounter: Secondary | ICD-10-CM | POA: Insufficient documentation

## 2018-05-19 DIAGNOSIS — Z79899 Other long term (current) drug therapy: Secondary | ICD-10-CM | POA: Insufficient documentation

## 2018-05-19 DIAGNOSIS — W01198A Fall on same level from slipping, tripping and stumbling with subsequent striking against other object, initial encounter: Secondary | ICD-10-CM | POA: Insufficient documentation

## 2018-05-19 MED ORDER — LIDOCAINE-EPINEPHRINE (PF) 2 %-1:200000 IJ SOLN
20.0000 mL | Freq: Once | INTRAMUSCULAR | Status: AC
  Administered 2018-05-19: 20 mL via INTRADERMAL
  Filled 2018-05-19: qty 20

## 2018-05-19 MED ORDER — TETANUS-DIPHTH-ACELL PERTUSSIS 5-2.5-18.5 LF-MCG/0.5 IM SUSP
0.5000 mL | Freq: Once | INTRAMUSCULAR | Status: AC
  Administered 2018-05-19: 0.5 mL via INTRAMUSCULAR
  Filled 2018-05-19: qty 0.5

## 2018-05-19 NOTE — ED Triage Notes (Signed)
Pt here after a  Trip and fall hitting her head , possible loc pt has a lac to her forehead bleeding controlled

## 2018-05-19 NOTE — ED Notes (Signed)
Pt was transported by wheelchair to [facility] ED per provider Dr Valere Dross  Pt fell and hit head on metal object and had a LOC for a few moments.

## 2018-05-19 NOTE — ED Provider Notes (Signed)
[facility] EMERGENCY DEPARTMENT Provider Note   CSN: [id number] Arrival date & time: 05/19/18  1616     History   Chief Complaint Chief Complaint  Patient presents with  . Fall    HPI [name] is a 68 y.o. female.  The history is provided by the patient. No language interpreter was used.  Fall  This is a new problem. The current episode started 1 to 2 hours ago. The problem occurs constantly. Associated symptoms include headaches. Pertinent negatives include no chest pain, no abdominal pain and no shortness of breath. Nothing aggravates the symptoms. Nothing relieves the symptoms. She has tried nothing for the symptoms. The treatment provided no relief.    Patient is a 68 year old female with a past medical history of hypertension, GERD, depression, anxiety who presents after being initially evaluated in urgent care center for further evaluation after a fall earlier this afternoon.  Patient states he tripped and struck her face against a metal bar in her house.  She states she distinctly remembers tripping and denies passing out or feeling like she was about to pass out before falling.  She is unsure if she had any LOC but states she came to on the ground.  She states she immediately felt pain on a cut on her left forehead as well as some pain on her left thumb base.  She denies any vomiting, neck pain, chest pain, cough, shortness of breath, abdominal pain, back pain, any other extremity pain numbness or tingling, or other acute complaints.  Denies prior similar episodes.  Denies alleviating or aggravating factors.  Denies being on blood thinners.  Past Medical History:  Diagnosis Date  . Anxiety   . Arthritis    oa  . Complication of anesthesia   . Depression   . GERD (gastroesophageal reflux disease)   . Hypertension   . Papilloma of right breast 08/04/2016    Patient Active Problem List   Diagnosis Date Noted  . Papilloma of right breast 08/04/2016     Past Surgical History:  Procedure Laterality Date  . ABDOMINAL HYSTERECTOMY    . APPENDECTOMY    . BREAST BIOPSY Right 05/11/2016   right stereo for calcs  . BREAST LUMPECTOMY WITH RADIOACTIVE SEED LOCALIZATION Right 08/04/2016   Procedure: RIGHT BREAST LUMPECTOMY WITH RADIOACTIVE SEED LOCALIZATION;  Surgeon: Fanny Skates, MD;  Location: [facility];  Service: General;  Laterality: Right;  . CHOLECYSTECTOMY       OB History   No obstetric history on file.      Home Medications    Prior to Admission medications   Medication Sig Start Date End Date Taking? Authorizing Provider  aspirin 325 MG tablet Take 325 mg by mouth at bedtime.    [provider]  Cholecalciferol (VITAMIN D3) 5000 units TABS Take by mouth at bedtime.    [provider]  HYDROcodone-acetaminophen (NORCO) 5-325 MG tablet Take 1-2 tablets by mouth every 6 (six) hours as needed for moderate pain or severe pain. 08/04/16   Fanny Skates, MD  ibuprofen (ADVIL,MOTRIN) 200 MG tablet Take 400-800 mg by mouth every 8 (eight) hours as needed (for pain/headaches.).    [provider]  metoprolol (LOPRESSOR) 50 MG tablet Take 50 mg by mouth 2 (two) times daily.    [provider]  Multiple Vitamin (MULTIVITAMIN WITH MINERALS) TABS tablet Take 1 tablet by mouth daily. Centrum Silver    [provider]  olopatadine (PATANOL) 0.1 % ophthalmic solution Place  1 drop into both eyes 2 (two) times daily. 06/12/17   Robyn Haber, MD  Omega-3 Fatty Acids (FISH OIL) 1200 MG CAPS Take 1,200 mg by mouth 2 (two) times daily.    [provider]  pantoprazole (PROTONIX) 40 MG tablet Take 40 mg by mouth 2 (two) times daily.     [provider]  sertraline (ZOLOFT) 100 MG tablet Take 200 mg by mouth daily.    [provider]  SUPER B COMPLEX/C PO Take 1 tablet by mouth daily.    [provider]  traZODone (DESYREL) 150 MG tablet Take 300 mg by mouth at bedtime.      [provider]  Turmeric 500 MG CAPS Take 500 mg by mouth 2 (two) times daily.    [provider]  vitamin E 400 UNIT capsule Take 400 Units by mouth at bedtime.    [provider]    Family History Family History  Problem Relation Age of Onset  . Breast cancer Mother 88  . Breast cancer Paternal Aunt     Social History Social History   Tobacco Use  . Smoking status: Never Smoker  . Smokeless tobacco: Never Used  Substance Use Topics  . Alcohol use: No  . Drug use: No     Allergies   Erythromycin; Valium [diazepam]; and Venlafaxine   Review of Systems Review of Systems  Constitutional: Negative for chills and fever.  HENT: Negative for ear pain and sore throat.   Eyes: Negative for pain and visual disturbance.  Respiratory: Negative for cough and shortness of breath.   Cardiovascular: Negative for chest pain and palpitations.  Gastrointestinal: Negative for abdominal pain and vomiting.  Genitourinary: Negative for dysuria and hematuria.  Musculoskeletal: Positive for arthralgias ( L wrist). Negative for back pain.  Skin: Positive for wound ( forehead). Negative for color change and rash.  Neurological: Positive for headaches. Negative for seizures and syncope.  All other systems reviewed and are negative.    Physical Exam Updated Vital Signs BP (!) 158/78   Pulse (!) 56   Temp (!) 97.2 F (36.2 C) (Oral)   Resp 16   Ht 5\' 4"  (1.626 m)   Wt 63.5 kg   SpO2 98%   BMI 24.03 kg/m   Physical Exam Vitals signs and nursing note reviewed.  Constitutional:      General: She is not in acute distress.    Appearance: Normal appearance. She is well-developed and normal weight.  HENT:     Head: Normocephalic and atraumatic.     Right Ear: External ear normal.     Left Ear: External ear normal.     Nose: Nose normal.     Mouth/Throat:     Mouth: Mucous membranes are moist.  Eyes:     Conjunctiva/sclera: Conjunctivae normal.  Neck:      Musculoskeletal: Neck supple. No neck rigidity.  Cardiovascular:     Rate and Rhythm: Normal rate and regular rhythm.     Pulses: Normal pulses.          Radial pulses are 2+ on the right side and 2+ on the left side.       Dorsalis pedis pulses are 2+ on the right side and 2+ on the left side.     Heart sounds: No murmur.  Pulmonary:     Effort: Pulmonary effort is normal. No respiratory distress.     Breath sounds: Normal breath sounds.  Abdominal:     Palpations:  Abdomen is soft.     Tenderness: There is no abdominal tenderness.  Musculoskeletal: Normal range of motion.        General: Tenderness ( over left snuff box area) present. No swelling or deformity.     Cervical back: She exhibits no bony tenderness.     Thoracic back: She exhibits no bony tenderness.     Lumbar back: She exhibits no bony tenderness.     Right lower leg: No edema.     Left lower leg: No edema.  Skin:    General: Skin is warm and dry.     Capillary Refill: Capillary refill takes less than 2 seconds.  Neurological:     General: No focal deficit present.     Mental Status: She is alert.     Cranial Nerves: No cranial nerve deficit.     Sensory: No sensory deficit.     Motor: No weakness.     Coordination: Coordination normal.  Psychiatric:        Mood and Affect: Mood normal.    Patient is able to flex and extend all digits in the left hand against resistance at the MTP joint, DIP, and PIP.  In addition she is able to flex extend her wrist against resistance.  She has 2+ bilateral radial pulses.  She has tenderness palpation over the snuffbox area.  No other tenderness palpation or signs of trauma to any other extremity.  Regarding the patient's skin she has a approximately 3 cm laceration over the left side of her forehead that is hemostatic on arrival.  ED Treatments / Results  Labs (all labs ordered are listed, but only abnormal results are displayed) Labs Reviewed - No data to  display  EKG EKG Interpretation  Date/Time:  Friday May 19 2018 17:11:27 EST Ventricular Rate:  64 PR Interval:    QRS Duration: 89 QT Interval:  453 QTC Calculation: 468 R Axis:   16 Text Interpretation:  Sinus rhythm Low voltage, precordial leads Confirmed by Davonna Belling [phone]) on 05/19/2018 6:15:23 PM   Radiology Dg Wrist Complete Left  Result Date: 05/19/2018 CLINICAL DATA:  fell forward. Pt hit her head and landed on her left wrist. Pain on medial aspect of left wrist. Pain begins from interphalangeal joint space of thumb to medial aspect of left wrist. No hx of prior injury. No hx of surgeries. EXAM: LEFT WRIST - COMPLETE 3+ VIEW COMPARISON:  None. FINDINGS: There is no evidence of fracture or dislocation. There is no evidence of arthropathy or other focal bone abnormality. Soft tissues are unremarkable. IMPRESSION: Negative. Electronically Signed   By: Lucrezia Europe M.D.   On: 05/19/2018 17:35   Ct Head Wo Contrast  Result Date: 05/19/2018 CLINICAL DATA:  Fall.  Left forehead laceration. EXAM: CT HEAD WITHOUT CONTRAST CT CERVICAL SPINE WITHOUT CONTRAST TECHNIQUE: Multidetector CT imaging of the head and cervical spine was performed following the standard protocol without intravenous contrast. Multiplanar CT image reconstructions of the cervical spine were also generated. COMPARISON:  None. FINDINGS: CT HEAD FINDINGS Brain: No evidence of acute infarction, hemorrhage, hydrocephalus, extra-axial collection or mass lesion/mass effect. Chronic tiny lacunar infarct in the right basal ganglia. Mild generalized cerebral atrophy. Scattered mild periventricular and subcortical white matter hypodensities are nonspecific, but favored to reflect chronic microvascular ischemic changes. Vascular: Calcified atherosclerosis at the skullbase. No hyperdense vessel. Skull: Normal. Negative for fracture or focal lesion. Sinuses/Orbits: No acute finding. Other: Mild left frontal scalp soft tissue  swelling. CT CERVICAL  SPINE FINDINGS Alignment: No traumatic malalignment. 2 mm facet mediated anterolisthesis at C5-C6 and C7-T1. Skull base and vertebrae: No acute fracture. No primary bone lesion or focal pathologic process. Soft tissues and spinal canal: No prevertebral fluid or swelling. No visible canal hematoma. Disc levels: Moderate to severe degenerative disc disease and moderate uncovertebral hypertrophy at C4-C5 and C6-C7. Mild facet arthropathy at C5-C6. Moderate facet arthropathy at C7-T1. Upper chest: Negative. Other: None. IMPRESSION: 1. No acute intracranial abnormality. Mild left frontal scalp soft tissue swelling. 2.  No acute cervical spine fracture. Electronically Signed   By: Titus Dubin M.D.   On: 05/19/2018 17:58   Ct Cervical Spine Wo Contrast  Result Date: 05/19/2018 CLINICAL DATA:  Fall.  Left forehead laceration. EXAM: CT HEAD WITHOUT CONTRAST CT CERVICAL SPINE WITHOUT CONTRAST TECHNIQUE: Multidetector CT imaging of the head and cervical spine was performed following the standard protocol without intravenous contrast. Multiplanar CT image reconstructions of the cervical spine were also generated. COMPARISON:  None. FINDINGS: CT HEAD FINDINGS Brain: No evidence of acute infarction, hemorrhage, hydrocephalus, extra-axial collection or mass lesion/mass effect. Chronic tiny lacunar infarct in the right basal ganglia. Mild generalized cerebral atrophy. Scattered mild periventricular and subcortical white matter hypodensities are nonspecific, but favored to reflect chronic microvascular ischemic changes. Vascular: Calcified atherosclerosis at the skullbase. No hyperdense vessel. Skull: Normal. Negative for fracture or focal lesion. Sinuses/Orbits: No acute finding. Other: Mild left frontal scalp soft tissue swelling. CT CERVICAL SPINE FINDINGS Alignment: No traumatic malalignment. 2 mm facet mediated anterolisthesis at C5-C6 and C7-T1. Skull base and vertebrae: No acute fracture. No  primary bone lesion or focal pathologic process. Soft tissues and spinal canal: No prevertebral fluid or swelling. No visible canal hematoma. Disc levels: Moderate to severe degenerative disc disease and moderate uncovertebral hypertrophy at C4-C5 and C6-C7. Mild facet arthropathy at C5-C6. Moderate facet arthropathy at C7-T1. Upper chest: Negative. Other: None. IMPRESSION: 1. No acute intracranial abnormality. Mild left frontal scalp soft tissue swelling. 2.  No acute cervical spine fracture. Electronically Signed   By: Titus Dubin M.D.   On: 05/19/2018 17:58    Procedures .Marland KitchenLaceration Repair Date/Time: 05/19/2018 11:37 PM Performed by: Hulan Saas, MD Authorized by: Hulan Saas, MD   Consent:    Consent obtained:  Verbal   Consent given by:  Patient   Risks discussed:  Pain, need for additional repair, poor wound healing and poor cosmetic result   Alternatives discussed:  No treatment Anesthesia (see MAR for exact dosages):    Anesthesia method:  Local infiltration   Local anesthetic:  Lidocaine 2% WITH epi Laceration details:    Location:  Scalp   Scalp location:  Frontal   Length (cm):  4 Repair type:    Repair type:  Simple Exploration:    Wound exploration: wound explored through full range of motion     Contaminated: no   Treatment:    Area cleansed with:  Saline   Amount of cleaning:  Standard   Irrigation method:  Syringe   Visualized foreign bodies/material removed: no   Skin repair:    Repair method:  Sutures   Suture size:  5-0   Suture material:  Fast-absorbing gut   Suture technique:  Simple interrupted   Number of sutures:  4 Approximation:    Approximation:  Close Post-procedure details:    Dressing:  Open (no dressing)   Patient tolerance of procedure:  Tolerated well, no immediate complications   (including critical care time)  Medications Ordered  in ED Medications  Tdap (BOOSTRIX) injection 0.5 mL (0.5 mLs Intramuscular Given 05/19/18 1705)   lidocaine-EPINEPHrine (XYLOCAINE W/EPI) 2 %-1:200000 (PF) injection 20 mL (20 mLs Intradermal Given by Other 05/19/18 1700)     Initial Impression / Assessment and Plan / ED Course  I have reviewed the triage vital signs and the nursing notes.  Pertinent labs & imaging results that were available during my care of the patient were reviewed by me and considered in my medical decision making (see chart for details).     Patient is a 68 year old female who presents with above-stated history exam.  On presentation patient is afebrile with stable vital signs.  Exam as above remarkable for laceration over the left side of forehead as well as some tenderness palpation over the left-sided snuffbox.  CT head and C-spine and 1. No acute intracranial abnormality. Mild left frontal scalp soft tissue swelling. 2.  No acute cervical spine fracture.  X-ray left wrist Negative.  Laceration repaired per procedure note above.  Given tenderness over this snuffbox on the left patient was placed in a thumb spica splint and instructed to follow-up with orthopedic surgery in approximately 1 week.  Patient voiced understanding agreement this plan.  Patient discharged in stable condition.  Strict return precautions advised and discussed.  Final Clinical Impressions(s) / ED Diagnoses   Final diagnoses:  Facial laceration, initial encounter  Fall, initial encounter  Left wrist pain    ED Discharge Orders    None       Hulan Saas, MD 05/19/18 [id number]    Davonna Belling, MD 05/20/18 [phone]

## 2018-05-19 NOTE — Progress Notes (Signed)
Orthopedic Tech Progress Note Patient Details:  [name] 07-13-1950 [id number]  Ortho Devices Type of Ortho Device: Thumb velcro splint Ortho Device/Splint Location: lue Ortho Device/Splint Interventions: Ordered, Application, Adjustment   Post Interventions Patient Tolerated: Well Instructions Provided: Care of device, Adjustment of device   [name] 05/19/2018, 7:10 PM

## 2018-05-19 NOTE — ED Notes (Signed)
Patient transported to X-ray 

## 2018-05-19 NOTE — ED Notes (Signed)
Patient verbalizes understanding of discharge instructions. Opportunity for questioning and answers were provided. Pt discharged from ED. 

## 2018-05-19 NOTE — ED Notes (Signed)
Paged ortho 

## 2018-05-29 DIAGNOSIS — R69 Illness, unspecified: Secondary | ICD-10-CM | POA: Diagnosis not present

## 2018-05-29 DIAGNOSIS — B349 Viral infection, unspecified: Secondary | ICD-10-CM | POA: Diagnosis not present

## 2018-05-29 DIAGNOSIS — J101 Influenza due to other identified influenza virus with other respiratory manifestations: Secondary | ICD-10-CM | POA: Diagnosis not present

## 2018-05-29 DIAGNOSIS — M542 Cervicalgia: Secondary | ICD-10-CM | POA: Diagnosis not present

## 2018-06-01 DIAGNOSIS — K219 Gastro-esophageal reflux disease without esophagitis: Secondary | ICD-10-CM | POA: Diagnosis not present

## 2018-06-01 DIAGNOSIS — Z1389 Encounter for screening for other disorder: Secondary | ICD-10-CM | POA: Diagnosis not present

## 2018-06-01 DIAGNOSIS — E785 Hyperlipidemia, unspecified: Secondary | ICD-10-CM | POA: Diagnosis not present

## 2018-06-01 DIAGNOSIS — E7800 Pure hypercholesterolemia, unspecified: Secondary | ICD-10-CM | POA: Diagnosis not present

## 2018-06-01 DIAGNOSIS — R69 Illness, unspecified: Secondary | ICD-10-CM | POA: Diagnosis not present

## 2018-12-04 DIAGNOSIS — M542 Cervicalgia: Secondary | ICD-10-CM | POA: Diagnosis not present

## 2018-12-04 DIAGNOSIS — R69 Illness, unspecified: Secondary | ICD-10-CM | POA: Diagnosis not present

## 2018-12-04 DIAGNOSIS — E7800 Pure hypercholesterolemia, unspecified: Secondary | ICD-10-CM | POA: Diagnosis not present

## 2018-12-04 DIAGNOSIS — R5383 Other fatigue: Secondary | ICD-10-CM | POA: Diagnosis not present

## 2018-12-04 DIAGNOSIS — N3281 Overactive bladder: Secondary | ICD-10-CM | POA: Diagnosis not present

## 2018-12-04 DIAGNOSIS — Z1389 Encounter for screening for other disorder: Secondary | ICD-10-CM | POA: Diagnosis not present

## 2019-02-22 DIAGNOSIS — R69 Illness, unspecified: Secondary | ICD-10-CM | POA: Diagnosis not present

## 2019-03-30 DIAGNOSIS — M1990 Unspecified osteoarthritis, unspecified site: Secondary | ICD-10-CM | POA: Diagnosis not present

## 2019-03-30 DIAGNOSIS — K219 Gastro-esophageal reflux disease without esophagitis: Secondary | ICD-10-CM | POA: Diagnosis not present

## 2019-03-30 DIAGNOSIS — M542 Cervicalgia: Secondary | ICD-10-CM | POA: Diagnosis not present

## 2019-03-30 DIAGNOSIS — E7800 Pure hypercholesterolemia, unspecified: Secondary | ICD-10-CM | POA: Diagnosis not present

## 2019-03-30 DIAGNOSIS — I10 Essential (primary) hypertension: Secondary | ICD-10-CM | POA: Diagnosis not present

## 2019-12-12 ENCOUNTER — Other Ambulatory Visit: Payer: Self-pay | Admitting: Family Medicine

## 2019-12-12 DIAGNOSIS — Z1231 Encounter for screening mammogram for malignant neoplasm of breast: Secondary | ICD-10-CM

## 2020-07-10 ENCOUNTER — Other Ambulatory Visit: Payer: Self-pay | Admitting: Family Medicine

## 2020-07-10 DIAGNOSIS — Z1231 Encounter for screening mammogram for malignant neoplasm of breast: Secondary | ICD-10-CM

## 2020-07-17 ENCOUNTER — Encounter: Payer: Self-pay | Admitting: *Deleted

## 2020-08-18 DIAGNOSIS — K219 Gastro-esophageal reflux disease without esophagitis: Secondary | ICD-10-CM | POA: Diagnosis not present

## 2020-08-18 DIAGNOSIS — N289 Disorder of kidney and ureter, unspecified: Secondary | ICD-10-CM | POA: Diagnosis not present

## 2020-08-18 DIAGNOSIS — Z13228 Encounter for screening for other metabolic disorders: Secondary | ICD-10-CM | POA: Diagnosis not present

## 2020-08-18 DIAGNOSIS — G4700 Insomnia, unspecified: Secondary | ICD-10-CM | POA: Diagnosis not present

## 2020-08-18 DIAGNOSIS — K222 Esophageal obstruction: Secondary | ICD-10-CM | POA: Diagnosis not present

## 2020-08-28 ENCOUNTER — Other Ambulatory Visit: Payer: Self-pay

## 2020-09-19 DIAGNOSIS — M7062 Trochanteric bursitis, left hip: Secondary | ICD-10-CM | POA: Diagnosis not present

## 2020-09-19 DIAGNOSIS — M7061 Trochanteric bursitis, right hip: Secondary | ICD-10-CM | POA: Diagnosis not present

## 2020-09-19 DIAGNOSIS — M545 Low back pain, unspecified: Secondary | ICD-10-CM | POA: Diagnosis not present

## 2020-10-03 DIAGNOSIS — U071 COVID-19: Secondary | ICD-10-CM | POA: Diagnosis not present

## 2020-10-03 DIAGNOSIS — Z1159 Encounter for screening for other viral diseases: Secondary | ICD-10-CM | POA: Diagnosis not present

## 2020-10-31 DIAGNOSIS — M7062 Trochanteric bursitis, left hip: Secondary | ICD-10-CM | POA: Diagnosis not present

## 2020-10-31 DIAGNOSIS — M545 Low back pain, unspecified: Secondary | ICD-10-CM | POA: Diagnosis not present

## 2020-12-01 DIAGNOSIS — N289 Disorder of kidney and ureter, unspecified: Secondary | ICD-10-CM | POA: Diagnosis not present

## 2020-12-01 DIAGNOSIS — U071 COVID-19: Secondary | ICD-10-CM | POA: Diagnosis not present

## 2020-12-01 DIAGNOSIS — R197 Diarrhea, unspecified: Secondary | ICD-10-CM | POA: Diagnosis not present

## 2020-12-01 DIAGNOSIS — E782 Mixed hyperlipidemia: Secondary | ICD-10-CM | POA: Diagnosis not present

## 2021-01-23 DIAGNOSIS — M545 Low back pain, unspecified: Secondary | ICD-10-CM | POA: Diagnosis not present

## 2021-02-16 ENCOUNTER — Other Ambulatory Visit: Payer: Self-pay | Admitting: Orthopedic Surgery

## 2021-02-16 DIAGNOSIS — M5440 Lumbago with sciatica, unspecified side: Secondary | ICD-10-CM

## 2021-03-06 ENCOUNTER — Ambulatory Visit: Payer: Medicare HMO

## 2021-03-23 DIAGNOSIS — N289 Disorder of kidney and ureter, unspecified: Secondary | ICD-10-CM | POA: Diagnosis not present

## 2021-03-23 DIAGNOSIS — E782 Mixed hyperlipidemia: Secondary | ICD-10-CM | POA: Diagnosis not present

## 2021-03-23 DIAGNOSIS — K529 Noninfective gastroenteritis and colitis, unspecified: Secondary | ICD-10-CM | POA: Diagnosis not present

## 2021-03-23 DIAGNOSIS — U071 COVID-19: Secondary | ICD-10-CM | POA: Diagnosis not present

## 2021-03-27 ENCOUNTER — Other Ambulatory Visit: Payer: Self-pay

## 2021-03-27 ENCOUNTER — Ambulatory Visit
Admission: RE | Admit: 2021-03-27 | Discharge: 2021-03-27 | Disposition: A | Discharge status: Home / Self Care | Payer: Medicare HMO | Source: Ambulatory Visit | Attending: Orthopedic Surgery | Admitting: Orthopedic Surgery

## 2021-03-27 DIAGNOSIS — M5440 Lumbago with sciatica, unspecified side: Secondary | ICD-10-CM | POA: Insufficient documentation

## 2021-03-27 DIAGNOSIS — R200 Anesthesia of skin: Secondary | ICD-10-CM | POA: Diagnosis not present

## 2021-03-27 DIAGNOSIS — M545 Low back pain, unspecified: Secondary | ICD-10-CM | POA: Diagnosis not present

## 2021-06-12 DIAGNOSIS — M5416 Radiculopathy, lumbar region: Secondary | ICD-10-CM | POA: Diagnosis not present

## 2021-06-12 DIAGNOSIS — M545 Low back pain, unspecified: Secondary | ICD-10-CM | POA: Diagnosis not present

## 2021-06-26 DIAGNOSIS — Z0000 Encounter for general adult medical examination without abnormal findings: Secondary | ICD-10-CM | POA: Diagnosis not present

## 2021-06-26 DIAGNOSIS — Z1283 Encounter for screening for malignant neoplasm of skin: Secondary | ICD-10-CM | POA: Diagnosis not present

## 2021-06-26 DIAGNOSIS — N289 Disorder of kidney and ureter, unspecified: Secondary | ICD-10-CM | POA: Diagnosis not present

## 2021-06-26 DIAGNOSIS — K529 Noninfective gastroenteritis and colitis, unspecified: Secondary | ICD-10-CM | POA: Diagnosis not present

## 2021-06-26 DIAGNOSIS — E782 Mixed hyperlipidemia: Secondary | ICD-10-CM | POA: Diagnosis not present

## 2021-06-26 DIAGNOSIS — R1319 Other dysphagia: Secondary | ICD-10-CM | POA: Diagnosis not present

## 2021-06-26 DIAGNOSIS — Z23 Encounter for immunization: Secondary | ICD-10-CM | POA: Diagnosis not present

## 2021-08-12 DIAGNOSIS — M5416 Radiculopathy, lumbar region: Secondary | ICD-10-CM | POA: Diagnosis not present

## 2021-08-28 DIAGNOSIS — M5416 Radiculopathy, lumbar region: Secondary | ICD-10-CM | POA: Diagnosis not present

## 2021-08-28 DIAGNOSIS — R1031 Right lower quadrant pain: Secondary | ICD-10-CM | POA: Diagnosis not present

## 2021-08-28 DIAGNOSIS — R1319 Other dysphagia: Secondary | ICD-10-CM | POA: Diagnosis not present

## 2021-08-28 DIAGNOSIS — R197 Diarrhea, unspecified: Secondary | ICD-10-CM | POA: Diagnosis not present

## 2021-08-28 DIAGNOSIS — K219 Gastro-esophageal reflux disease without esophagitis: Secondary | ICD-10-CM | POA: Diagnosis not present

## 2021-08-28 DIAGNOSIS — R194 Change in bowel habit: Secondary | ICD-10-CM | POA: Diagnosis not present

## 2021-08-28 DIAGNOSIS — R634 Abnormal weight loss: Secondary | ICD-10-CM | POA: Diagnosis not present

## 2021-08-28 DIAGNOSIS — R1032 Left lower quadrant pain: Secondary | ICD-10-CM | POA: Diagnosis not present

## 2021-09-01 DIAGNOSIS — R194 Change in bowel habit: Secondary | ICD-10-CM | POA: Diagnosis not present

## 2021-09-01 DIAGNOSIS — R197 Diarrhea, unspecified: Secondary | ICD-10-CM | POA: Diagnosis not present

## 2021-09-01 DIAGNOSIS — R634 Abnormal weight loss: Secondary | ICD-10-CM | POA: Diagnosis not present

## 2021-09-15 ENCOUNTER — Ambulatory Visit: Admit: 2021-09-15 | Payer: Medicare HMO | Admitting: Internal Medicine

## 2021-09-15 SURGERY — COLONOSCOPY WITH PROPOFOL
Anesthesia: General

## 2021-11-06 DIAGNOSIS — K317 Polyp of stomach and duodenum: Secondary | ICD-10-CM | POA: Diagnosis not present

## 2021-11-06 DIAGNOSIS — R634 Abnormal weight loss: Secondary | ICD-10-CM | POA: Diagnosis not present

## 2021-11-06 DIAGNOSIS — R194 Change in bowel habit: Secondary | ICD-10-CM | POA: Diagnosis not present

## 2021-11-06 DIAGNOSIS — K5730 Diverticulosis of large intestine without perforation or abscess without bleeding: Secondary | ICD-10-CM | POA: Diagnosis not present

## 2021-11-06 DIAGNOSIS — K640 First degree hemorrhoids: Secondary | ICD-10-CM | POA: Diagnosis not present

## 2021-11-06 DIAGNOSIS — R197 Diarrhea, unspecified: Secondary | ICD-10-CM | POA: Diagnosis not present

## 2021-11-06 DIAGNOSIS — K219 Gastro-esophageal reflux disease without esophagitis: Secondary | ICD-10-CM | POA: Diagnosis not present

## 2021-11-06 DIAGNOSIS — K3189 Other diseases of stomach and duodenum: Secondary | ICD-10-CM | POA: Diagnosis not present

## 2021-11-06 DIAGNOSIS — R1319 Other dysphagia: Secondary | ICD-10-CM | POA: Diagnosis not present

## 2021-12-07 DIAGNOSIS — E782 Mixed hyperlipidemia: Secondary | ICD-10-CM | POA: Diagnosis not present

## 2021-12-07 DIAGNOSIS — E559 Vitamin D deficiency, unspecified: Secondary | ICD-10-CM | POA: Diagnosis not present

## 2022-06-07 DIAGNOSIS — I10 Essential (primary) hypertension: Secondary | ICD-10-CM | POA: Diagnosis not present

## 2022-06-07 DIAGNOSIS — N289 Disorder of kidney and ureter, unspecified: Secondary | ICD-10-CM | POA: Diagnosis not present

## 2022-06-07 DIAGNOSIS — E782 Mixed hyperlipidemia: Secondary | ICD-10-CM | POA: Diagnosis not present

## 2022-12-18 DIAGNOSIS — M1990 Unspecified osteoarthritis, unspecified site: Secondary | ICD-10-CM | POA: Diagnosis not present

## 2022-12-18 DIAGNOSIS — M810 Age-related osteoporosis without current pathological fracture: Secondary | ICD-10-CM | POA: Diagnosis not present

## 2022-12-18 DIAGNOSIS — G4700 Insomnia, unspecified: Secondary | ICD-10-CM | POA: Diagnosis not present

## 2022-12-18 DIAGNOSIS — K581 Irritable bowel syndrome with constipation: Secondary | ICD-10-CM | POA: Diagnosis not present

## 2022-12-18 DIAGNOSIS — F411 Generalized anxiety disorder: Secondary | ICD-10-CM | POA: Diagnosis not present

## 2022-12-18 DIAGNOSIS — J302 Other seasonal allergic rhinitis: Secondary | ICD-10-CM | POA: Diagnosis not present

## 2022-12-18 DIAGNOSIS — I471 Supraventricular tachycardia, unspecified: Secondary | ICD-10-CM | POA: Diagnosis not present

## 2022-12-18 DIAGNOSIS — R32 Unspecified urinary incontinence: Secondary | ICD-10-CM | POA: Diagnosis not present

## 2022-12-18 DIAGNOSIS — E785 Hyperlipidemia, unspecified: Secondary | ICD-10-CM | POA: Diagnosis not present

## 2022-12-18 DIAGNOSIS — K219 Gastro-esophageal reflux disease without esophagitis: Secondary | ICD-10-CM | POA: Diagnosis not present

## 2022-12-18 DIAGNOSIS — M5440 Lumbago with sciatica, unspecified side: Secondary | ICD-10-CM | POA: Diagnosis not present

## 2022-12-18 DIAGNOSIS — I129 Hypertensive chronic kidney disease with stage 1 through stage 4 chronic kidney disease, or unspecified chronic kidney disease: Secondary | ICD-10-CM | POA: Diagnosis not present

## 2023-08-25 ENCOUNTER — Other Ambulatory Visit: Payer: Self-pay | Admitting: General Practice

## 2023-08-25 ENCOUNTER — Telehealth: Payer: Self-pay | Admitting: Family Medicine

## 2023-08-25 NOTE — Telephone Encounter (Signed)
 Copied from CRM [id number]. Topic: Clinical - Medication Refill >> Aug 25, 2023  2:08 PM Lonzell Robin C wrote: Most Recent Primary Care Visit:   Medication: traZODone (DESYREL) 150 MG tablet metoprolol (LOPRESSOR) 50 MG tablet pantoprazole (PROTONIX) 40 MG tablet sertraline (ZOLOFT) 100 MG tablet Has the patient contacted their pharmacy? No (Agent: If no, request that the patient contact the pharmacy for the refill. If patient does not wish to contact the pharmacy document the reason why and proceed with request.) (Agent: If yes, when and what did the pharmacy advise?)  Is this the correct pharmacy for this prescription? Yes If no, delete pharmacy and type the correct one.  This is the patient's preferred pharmacy:  [facility] [phone], [place] - [address] [place] [address] Phone: [phone] Fax: [phone]   Has the prescription been filled recently? No  Is the patient out of the medication? Yes  Has the patient been seen for an appointment in the last year OR does the patient have an upcoming appointment? Yes  Can we respond through MyChart? No  Agent: Please be advised that Rx refills may take up to 3 business days. We ask that you follow-up with your pharmacy.

## 2023-08-25 NOTE — Telephone Encounter (Signed)
 Called patient to review options for medication refills/new prescriptions and  available resources to manage medications until 1st encounter with new provider in June at [facility]. No answer, LVMTCB #[phone].        Copied from CRM [id number]. Topic: Clinical - Prescription Issue >> Aug 25, 2023  9:35 AM [place] E wrote: Reason for CRM: Patient will be getting established with new provider in June, but she is going to be out of 4 of her medications before that appointment. Relayed to patient that her previous provider could fill those medications until she get's established, but patient stated that that office completely closed. Medications she will be out of are: traZODone (DESYREL) 150 MG tablet metoprolol (LOPRESSOR) 50 MG tablet pantoprazole (PROTONIX) 40 MG tablet sertraline (ZOLOFT) 100 MG tablet

## 2023-08-25 NOTE — Telephone Encounter (Unsigned)
 Copied from CRM [id number]. Topic: Clinical - Medication Refill >> Aug 25, 2023  2:13 PM Ovid Blow wrote: Most Recent Primary Care Visit:   Medication: Dicyclomine  Has the patient contacted their pharmacy? No (Agent: If no, request that the patient contact the pharmacy for the refill. If patient does not wish to contact the pharmacy document the reason why and proceed with request.) (Agent: If yes, when and what did the pharmacy advise?)  Is this the correct pharmacy for this prescription? Yes If no, delete pharmacy and type the correct one.  This is the patient's preferred pharmacy:  [phone], [place] - [address] [place] [address] Phone: [phone] Fax: [phone]   Has the prescription been filled recently? No  Is the patient out of the medication? Yes  Has the patient been seen for an appointment in the last year OR does the patient have an upcoming appointment? Yes  Can we respond through MyChart? No  Agent: Please be advised that Rx refills may take up to 3 business days. We ask that you follow-up with your pharmacy.

## 2023-09-09 ENCOUNTER — Telehealth: Payer: Self-pay | Admitting: General Practice

## 2023-09-09 NOTE — Telephone Encounter (Signed)
 Copied from CRM [id number]. Topic: Appointments - Appointment Cancel/Reschedule >> Sep 09, 2023  2:17 PM Howard Macho wrote: Patient/patient representative is calling to cancel or reschedule an appointment. Refer to attachments for appointment information.   Patient called stating she is running out of her metoprolol (LOPRESSOR) 50 MG tablet, sertraline (ZOLOFT) 100 MG tablet and  pantoprazole (PROTONIX) 40 MG tablet and want to see if she can get a refill. Patient has a new patient appointment on 6/10  CB [phone]

## 2023-09-09 NOTE — Telephone Encounter (Signed)
 Spoke with patient and moved appt to 09/16/23 at 11 am. Patient verbalized understanding that we could not refill meds until she is seen.

## 2023-09-09 NOTE — Telephone Encounter (Addendum)
 Are you okay refilling below medication before her scheduled appt in June? I spoke with patient and her previous office closed already and can not get any refills from them as she was told she would get before they closed.

## 2023-09-15 DIAGNOSIS — I10 Essential (primary) hypertension: Secondary | ICD-10-CM | POA: Insufficient documentation

## 2023-09-15 DIAGNOSIS — M1990 Unspecified osteoarthritis, unspecified site: Secondary | ICD-10-CM | POA: Insufficient documentation

## 2023-09-16 ENCOUNTER — Ambulatory Visit (INDEPENDENT_AMBULATORY_CARE_PROVIDER_SITE_OTHER): Admitting: General Practice

## 2023-09-16 ENCOUNTER — Encounter: Payer: Self-pay | Admitting: General Practice

## 2023-09-16 VITALS — BP 150/70 | HR 69 | Temp 98.5°F | Ht 62.0 in | Wt 138.0 lb

## 2023-09-16 DIAGNOSIS — F419 Anxiety disorder, unspecified: Secondary | ICD-10-CM | POA: Diagnosis not present

## 2023-09-16 DIAGNOSIS — K219 Gastro-esophageal reflux disease without esophagitis: Secondary | ICD-10-CM | POA: Diagnosis not present

## 2023-09-16 DIAGNOSIS — F32A Depression, unspecified: Secondary | ICD-10-CM | POA: Insufficient documentation

## 2023-09-16 DIAGNOSIS — Z7689 Persons encountering health services in other specified circumstances: Secondary | ICD-10-CM | POA: Insufficient documentation

## 2023-09-16 DIAGNOSIS — I10 Essential (primary) hypertension: Secondary | ICD-10-CM | POA: Diagnosis not present

## 2023-09-16 DIAGNOSIS — Z1159 Encounter for screening for other viral diseases: Secondary | ICD-10-CM

## 2023-09-16 DIAGNOSIS — G8929 Other chronic pain: Secondary | ICD-10-CM | POA: Diagnosis not present

## 2023-09-16 DIAGNOSIS — E7800 Pure hypercholesterolemia, unspecified: Secondary | ICD-10-CM | POA: Insufficient documentation

## 2023-09-16 DIAGNOSIS — F5104 Psychophysiologic insomnia: Secondary | ICD-10-CM | POA: Insufficient documentation

## 2023-09-16 DIAGNOSIS — M545 Low back pain, unspecified: Secondary | ICD-10-CM | POA: Diagnosis not present

## 2023-09-16 DIAGNOSIS — E782 Mixed hyperlipidemia: Secondary | ICD-10-CM | POA: Insufficient documentation

## 2023-09-16 DIAGNOSIS — R109 Unspecified abdominal pain: Secondary | ICD-10-CM | POA: Diagnosis not present

## 2023-09-16 LAB — CBC
HCT: 42.9 % (ref 36.0–46.0)
Hemoglobin: 14.4 g/dL (ref 12.0–15.0)
MCHC: 33.5 g/dL (ref 30.0–36.0)
MCV: 92.7 fl (ref 78.0–100.0)
Platelets: 217 10*3/uL (ref 150.0–400.0)
RBC: 4.63 Mil/uL (ref 3.87–5.11)
RDW: 13.1 % (ref 11.5–15.5)
WBC: 6.1 10*3/uL (ref 4.0–10.5)

## 2023-09-16 LAB — HEMOGLOBIN A1C: Hgb A1c MFr Bld: 5.8 % (ref 4.6–6.5)

## 2023-09-16 LAB — COMPREHENSIVE METABOLIC PANEL WITH GFR
ALT: 18 U/L (ref 0–35)
AST: 27 U/L (ref 0–37)
Albumin: 4.7 g/dL (ref 3.5–5.2)
Alkaline Phosphatase: 74 U/L (ref 39–117)
BUN: 29 mg/dL — ABNORMAL HIGH (ref 6–23)
CO2: 30 meq/L (ref 19–32)
Calcium: 9.9 mg/dL (ref 8.4–10.5)
Chloride: 99 meq/L (ref 96–112)
Creatinine, Ser: 1.27 mg/dL — ABNORMAL HIGH (ref 0.40–1.20)
GFR: 41.58 mL/min — ABNORMAL LOW (ref >60.00)
Glucose, Bld: 91 mg/dL (ref 70–99)
Potassium: 4.5 meq/L (ref 3.5–5.1)
Sodium: 140 meq/L (ref 135–145)
Total Bilirubin: 0.5 mg/dL (ref 0.2–1.2)
Total Protein: 7.4 g/dL (ref 6.0–8.3)

## 2023-09-16 LAB — LIPID PANEL
Cholesterol: 329 mg/dL — ABNORMAL HIGH (ref 0–200)
HDL: 73.6 mg/dL (ref >39.00)
LDL Cholesterol: 216 mg/dL — ABNORMAL HIGH (ref 0–99)
NonHDL: 255.85
Total CHOL/HDL Ratio: 4
Triglycerides: 198 mg/dL — ABNORMAL HIGH (ref 0.0–149.0)
VLDL: 39.6 mg/dL (ref 0.0–40.0)

## 2023-09-16 LAB — TSH: TSH: 2.43 u[IU]/mL (ref 0.35–5.50)

## 2023-09-16 MED ORDER — METOPROLOL TARTRATE 25 MG PO TABS: 25.0000 mg | ORAL_TABLET | Freq: Two times a day (BID) | ORAL | 0 refills | Status: DC

## 2023-09-16 MED ORDER — SERTRALINE HCL 100 MG PO TABS: 200.0000 mg | ORAL_TABLET | Freq: Every day | ORAL | 0 refills | Status: DC

## 2023-09-16 MED ORDER — TRAZODONE HCL 150 MG PO TABS: 300.0000 mg | ORAL_TABLET | Freq: Every day | ORAL | 0 refills | Status: DC

## 2023-09-16 MED ORDER — TIZANIDINE HCL 4 MG PO TABS: 4.0000 mg | ORAL_TABLET | Freq: Every day | ORAL | 0 refills | Status: DC

## 2023-09-16 MED ORDER — DICYCLOMINE HCL 10 MG PO CAPS: 10.0000 mg | ORAL_CAPSULE | Freq: Two times a day (BID) | ORAL | 0 refills | Status: DC | PRN

## 2023-09-16 MED ORDER — PANTOPRAZOLE SODIUM 40 MG PO TBEC: 40.0000 mg | DELAYED_RELEASE_TABLET | Freq: Two times a day (BID) | ORAL | 2 refills | Status: DC

## 2023-09-16 NOTE — Assessment & Plan Note (Addendum)
 Controlled.   Continue sertraline 100 mg once daily. Refill sent. We did discuss starting it at 50 mg once daily for one week and then increasing to 100 mg once daily as she has been off of it for one month.  Follow up in 4 weeks.

## 2023-09-16 NOTE — Assessment & Plan Note (Signed)
 Chronic.  Has been off of meds for a long time now.   Lipid panel pending.

## 2023-09-16 NOTE — Assessment & Plan Note (Addendum)
 Chronic and controlled.  Has not seen ortho.   Continue Tizanidine 4 mg at bedtime. Rx sent.  Discussed side effects.

## 2023-09-16 NOTE — Assessment & Plan Note (Signed)
 Controlled.   Continue Pantoprazole 40 mg BID. Rx sent.   Follow up in 4 weeks.

## 2023-09-16 NOTE — Assessment & Plan Note (Signed)
 EMR reviewed briefly.

## 2023-09-16 NOTE — Progress Notes (Signed)
 New Patient Office Visit  Subjective    Patient ID: [name], female    DOB: [birth year]  Age: 73 y.o. MRN: [id number]  CC:  Chief Complaint  Patient presents with   New Patient (Initial Visit)    HPI [name] is a 73 y.o. female presents to establish care.  Last PCP/physical/labs: [facility].   She has been out of her medications for atleast a month.   HTN: diagnosed many years ago. Currently managed on Metoprolol 25 mg once daily. She has a BP cuff at home and home readings have been high lately. She denies any blurred vision, chest pain, shortness of breath or difficulty breathing.   GERD: diagnosed many years ago. Currently managed on Protonix 40 once daily. Tolerating it well. Last endo/colon five years ago. She reports the procedure was in [place].   Anxiety and depression and insomnia: diagnosed many years ago. Currently managed sertraline 100 mg once daily. She denies SI/HI. She has been tolerating her medication well. Has been on Trazodone 150-300 mg. Tolerating it well. Her husband passed away in May 12, 2024. She has not been in therapy.   Back pain: diagnosed many years ago. Currently managed on Tizanidine 4 mg at bedtime. Tolerating it well.   She needs refills on all of her medications as she has been out of them for at least a month or more.   Outpatient Encounter Medications as of 09/16/2023  Medication Sig   APPLE CIDER VINEGAR PO Take by mouth.   aspirin 325 MG tablet Take 325 mg by mouth at bedtime.   ibuprofen  (ADVIL ,MOTRIN ) 200 MG tablet Take 400-800 mg by mouth every 8 (eight) hours as needed (for pain/headaches.).   MAGNESIUM COMPLEX HIGH POTENCY PO Take by mouth.   Multiple Vitamin (MULTIVITAMIN WITH MINERALS) TABS tablet Take 1 tablet by mouth daily. Centrum Silver   olopatadine  (PATANOL) 0.1 % ophthalmic solution Place 1 drop into both eyes 2 (two) times daily.   Omega-3 Fatty Acids (FISH OIL) 1200 MG CAPS Take 1,200 mg by mouth 2 (two)  times daily.   Turmeric 500 MG CAPS Take 500 mg by mouth 2 (two) times daily.   [DISCONTINUED] dicyclomine (BENTYL) 10 MG capsule Take 10 mg by mouth every 6 (six) hours.   [DISCONTINUED] metoprolol tartrate (LOPRESSOR) 25 MG tablet Take 1 tablet by mouth 2 (two) times daily.   [DISCONTINUED] pantoprazole (PROTONIX) 40 MG tablet Take 40 mg by mouth 2 (two) times daily.    [DISCONTINUED] sertraline (ZOLOFT) 100 MG tablet Take 200 mg by mouth daily.   [DISCONTINUED] tiZANidine (ZANAFLEX) 4 MG tablet Take 1 tablet by mouth at bedtime.   [DISCONTINUED] traZODone (DESYREL) 150 MG tablet Take 300 mg by mouth at bedtime.    dicyclomine (BENTYL) 10 MG capsule Take 1 capsule (10 mg total) by mouth 2 (two) times daily as needed for spasms.   metoprolol tartrate (LOPRESSOR) 25 MG tablet Take 1 tablet (25 mg total) by mouth 2 (two) times daily.   pantoprazole (PROTONIX) 40 MG tablet Take 1 tablet (40 mg total) by mouth 2 (two) times daily.   sertraline (ZOLOFT) 100 MG tablet Take 2 tablets (200 mg total) by mouth daily.   tiZANidine (ZANAFLEX) 4 MG tablet Take 1 tablet (4 mg total) by mouth at bedtime.   traZODone (DESYREL) 150 MG tablet Take 2 tablets (300 mg total) by mouth at bedtime.   [DISCONTINUED] Cholecalciferol (VITAMIN D3) 5000 units TABS Take by mouth at bedtime.   [DISCONTINUED]  HYDROcodone -acetaminophen  (NORCO) 5-325 MG tablet Take 1-2 tablets by mouth every 6 (six) hours as needed for moderate pain or severe pain.   [DISCONTINUED] metoprolol (LOPRESSOR) 50 MG tablet Take 50 mg by mouth 2 (two) times daily.   [DISCONTINUED] SUPER B COMPLEX/C PO Take 1 tablet by mouth daily.   [DISCONTINUED] vitamin E 400 UNIT capsule Take 400 Units by mouth at bedtime.   No facility-administered encounter medications on file as of 09/16/2023.    Past Medical History:  Diagnosis Date   Anxiety    Arthritis    oa   Complication of anesthesia    Depression    GERD (gastroesophageal reflux disease)     Hypertension    Papilloma of right breast 08/04/2016    Past Surgical History:  Procedure Laterality Date   ABDOMINAL HYSTERECTOMY     APPENDECTOMY     BREAST BIOPSY Right 05/11/2016   right stereo for calcs   BREAST LUMPECTOMY WITH RADIOACTIVE SEED LOCALIZATION Right 08/04/2016   Procedure: RIGHT BREAST LUMPECTOMY WITH RADIOACTIVE SEED LOCALIZATION;  Surgeon: Boyce Byes, MD;  Location: [facility] OR;  Service: General;  Laterality: Right;   CHOLECYSTECTOMY      Family History  Problem Relation Age of Onset   Breast cancer Mother 30   Breast cancer Paternal Aunt     Social History   Socioeconomic History   Marital status: Married    Spouse name: Not on file   Number of children: Not on file   Years of education: Not on file   Highest education level: Not on file  Occupational History   Not on file  Tobacco Use   Smoking status: Never   Smokeless tobacco: Never  Substance and Sexual Activity   Alcohol use: No   Drug use: No   Sexual activity: Not on file  Other Topics Concern   Not on file  Social History Narrative   Not on file   Social Drivers of Health   Financial Resource Strain: Not on file  Food Insecurity: Not on file  Transportation Needs: Not on file  Physical Activity: Not on file  Stress: Not on file  Social Connections: Not on file  Intimate Partner Violence: Not on file    Review of Systems  Constitutional:  Negative for chills and fever.  Respiratory:  Negative for shortness of breath.   Cardiovascular:  Negative for chest pain.  Gastrointestinal:  Negative for abdominal pain, constipation, diarrhea, heartburn, nausea and vomiting.  Genitourinary:  Negative for dysuria, frequency and urgency.  Neurological:  Negative for dizziness and headaches.  Endo/Heme/Allergies:  Negative for polydipsia.  Psychiatric/Behavioral:  Negative for depression and suicidal ideas. The patient is not nervous/anxious.         Objective    BP (!) 150/70 (BP  Location: Left Arm, Patient Position: Sitting, Cuff Size: Normal)   Pulse 69   Temp 98.5 F (36.9 C) (Oral)   Ht 5\' 2"  (1.575 m)   Wt 138 lb (62.6 kg)   SpO2 97%   BMI 25.24 kg/m   Physical Exam Vitals and nursing note reviewed.  Constitutional:      Appearance: Normal appearance.  Cardiovascular:     Rate and Rhythm: Normal rate and regular rhythm.     Pulses: Normal pulses.     Heart sounds: Normal heart sounds.  Pulmonary:     Effort: Pulmonary effort is normal.     Breath sounds: Normal breath sounds.  Neurological:     Mental Status: She  is alert and oriented to person, place, and time.  Psychiatric:        Mood and Affect: Mood normal.        Behavior: Behavior normal.        Thought Content: Thought content normal.        Judgment: Judgment normal.         Assessment & Plan:  Primary hypertension Assessment & Plan: BP high on first reading but better with second reading.   Restart Metoprolol 25 mg once daily. Rx sent.  BP log given to check BP at home and bring back.   F/u in four weeks.  Labs pending.  Orders: -     Metoprolol Tartrate; Take 1 tablet (25 mg total) by mouth 2 (two) times daily.  Dispense: 90 tablet; Refill: 0 -     CBC -     Comprehensive metabolic panel with GFR -     Lipid panel -     TSH -     Hemoglobin A1c  Establishing care with new doctor, encounter for Assessment & Plan: EMR reviewed briefly.    Abdominal spasms Assessment & Plan: Chronic.  Controlled.  Previewed GI notes from 2023.   Continue Bentyl 10 mg BID as needed. Refill sent. She has been taking it daily.   Orders: -     Dicyclomine HCl; Take 1 capsule (10 mg total) by mouth 2 (two) times daily as needed for spasms.  Dispense: 90 capsule; Refill: 0  Gastroesophageal reflux disease, unspecified whether esophagitis present Assessment & Plan: Controlled.   Continue Pantoprazole 40 mg BID. Rx sent.   Follow up in 4 weeks.  Orders: -     Pantoprazole  Sodium; Take 1 tablet (40 mg total) by mouth 2 (two) times daily.  Dispense: 60 tablet; Refill: 2  Anxiety and depression Assessment & Plan: Controlled.   Continue sertraline 100 mg once daily. Refill sent. We did discuss starting it at 50 mg once daily for one week and then increasing to 100 mg once daily as she has been off of it for one month.  Follow up in 4 weeks.  Orders: -     Sertraline HCl; Take 2 tablets (200 mg total) by mouth daily.  Dispense: 90 tablet; Refill: 0  Chronic insomnia Assessment & Plan: Chronic.  Controlled.  Continue Trazodone 150 mg at bedtime.  Refill sent.  Orders: -     traZODone HCl; Take 2 tablets (300 mg total) by mouth at bedtime.  Dispense: 90 tablet; Refill: 0  Chronic midline low back pain, unspecified whether sciatica present Assessment & Plan: Chronic and controlled.  Has not seen ortho.   Continue Tizanidine 4 mg at bedtime. Rx sent.  Discussed side effects.  Orders: -     tiZANidine HCl; Take 1 tablet (4 mg total) by mouth at bedtime.  Dispense: 90 tablet; Refill: 0  High cholesterol Assessment & Plan: Chronic.  Has been off of meds for a long time now.   Lipid panel pending.  Orders: -     CBC -     Comprehensive metabolic panel with GFR -     Lipid panel -     TSH  Need for hepatitis C screening test -     Hepatitis C antibody     Return for HTN and health maintainence .Aaron Aas   Emma Nation, NP

## 2023-09-16 NOTE — Assessment & Plan Note (Signed)
>>  ASSESSMENT AND PLAN FOR HIGH CHOLESTEROL WRITTEN ON 09/16/2023 11:49 AM BY Jolanda Nation, NP  Chronic.  Has been off of meds for a long time now.   Lipid panel pending.

## 2023-09-16 NOTE — Assessment & Plan Note (Signed)
 Chronic.  Controlled.  Continue Trazodone 150 mg at bedtime.  Refill sent.

## 2023-09-16 NOTE — Assessment & Plan Note (Addendum)
 Chronic.  Controlled.  Previewed GI notes from 2023.   Continue Bentyl 10 mg BID as needed. Refill sent. She has been taking it daily.

## 2023-09-16 NOTE — Assessment & Plan Note (Signed)
 BP high on first reading but better with second reading.   Restart Metoprolol 25 mg once daily. Rx sent.  BP log given to check BP at home and bring back.   F/u in four weeks.  Labs pending.

## 2023-09-16 NOTE — Patient Instructions (Addendum)
 Stop by the lab prior to leaving today. I will notify you of your results once received.   Schedule shingles vaccine at the pharmacy.   Follow up in 4 weeks.   It was a pleasure to meet you today! Please don't hesitate to contact me with any questions. Welcome to [facility]!

## 2023-09-17 LAB — HEPATITIS C ANTIBODY: Hepatitis C Ab: NONREACTIVE

## 2023-09-21 ENCOUNTER — Ambulatory Visit: Payer: Self-pay

## 2023-10-08 ENCOUNTER — Other Ambulatory Visit: Payer: Self-pay | Admitting: General Practice

## 2023-10-08 DIAGNOSIS — R109 Unspecified abdominal pain: Secondary | ICD-10-CM

## 2023-10-14 ENCOUNTER — Ambulatory Visit (INDEPENDENT_AMBULATORY_CARE_PROVIDER_SITE_OTHER): Admitting: General Practice

## 2023-10-14 ENCOUNTER — Encounter: Payer: Self-pay | Admitting: General Practice

## 2023-10-14 ENCOUNTER — Ambulatory Visit: Payer: Self-pay | Admitting: General Practice

## 2023-10-14 VITALS — BP 124/72 | HR 60 | Temp 98.0°F | Ht 62.0 in | Wt 140.4 lb

## 2023-10-14 DIAGNOSIS — Z1231 Encounter for screening mammogram for malignant neoplasm of breast: Secondary | ICD-10-CM | POA: Diagnosis not present

## 2023-10-14 DIAGNOSIS — K219 Gastro-esophageal reflux disease without esophagitis: Secondary | ICD-10-CM | POA: Diagnosis not present

## 2023-10-14 DIAGNOSIS — E782 Mixed hyperlipidemia: Secondary | ICD-10-CM | POA: Diagnosis not present

## 2023-10-14 DIAGNOSIS — Z780 Asymptomatic menopausal state: Secondary | ICD-10-CM | POA: Diagnosis not present

## 2023-10-14 DIAGNOSIS — I10 Essential (primary) hypertension: Secondary | ICD-10-CM

## 2023-10-14 DIAGNOSIS — F5104 Psychophysiologic insomnia: Secondary | ICD-10-CM | POA: Diagnosis not present

## 2023-10-14 DIAGNOSIS — Z789 Other specified health status: Secondary | ICD-10-CM | POA: Insufficient documentation

## 2023-10-14 DIAGNOSIS — N1832 Chronic kidney disease, stage 3b: Secondary | ICD-10-CM | POA: Insufficient documentation

## 2023-10-14 DIAGNOSIS — F419 Anxiety disorder, unspecified: Secondary | ICD-10-CM | POA: Diagnosis not present

## 2023-10-14 DIAGNOSIS — F32A Depression, unspecified: Secondary | ICD-10-CM | POA: Diagnosis not present

## 2023-10-14 LAB — BASIC METABOLIC PANEL WITH GFR
BUN: 23 mg/dL (ref 6–23)
CO2: 30 meq/L (ref 19–32)
Calcium: 10 mg/dL (ref 8.4–10.5)
Chloride: 99 meq/L (ref 96–112)
Creatinine, Ser: 1.15 mg/dL (ref 0.40–1.20)
GFR: 46.81 mL/min — ABNORMAL LOW (ref >60.00)
Glucose, Bld: 91 mg/dL (ref 70–99)
Potassium: 4.2 meq/L (ref 3.5–5.1)
Sodium: 139 meq/L (ref 135–145)

## 2023-10-14 MED ORDER — TRAZODONE HCL 150 MG PO TABS: 150.0000 mg | ORAL_TABLET | Freq: Every day | ORAL | Status: DC

## 2023-10-14 MED ORDER — EZETIMIBE 10 MG PO TABS
10.0000 mg | ORAL_TABLET | Freq: Every day | ORAL | 0 refills | Status: DC
Start: 2023-10-14 — End: 2024-01-03

## 2023-10-14 NOTE — Assessment & Plan Note (Signed)
 Controlled. Continue sertraline  100 mg once daily.  Follow-up in 2 weeks.

## 2023-10-14 NOTE — Assessment & Plan Note (Signed)
 Chronic and controlled. Discussed at length and patient will continue trazodone  150 mg at bedtime.

## 2023-10-14 NOTE — Assessment & Plan Note (Signed)
 Acute on chronic. Asymptomatic.  Stable for outpatient treatment. Plan number provided to patient to call and schedule appointment with nephrologist as it was found in Care Everywhere. Discussed increasing water intake. Will monitor closely. Repeat BMP pending. Follow-up in 2 weeks.

## 2023-10-14 NOTE — Assessment & Plan Note (Signed)
 Controlled. Continue Protonix  40 mg twice daily.

## 2023-10-14 NOTE — Assessment & Plan Note (Signed)
 At goal today. Continue metoprolol  25 mg once daily. BMP pending.

## 2023-10-14 NOTE — Progress Notes (Signed)
 Established Patient Office Visit  Subjective   Patient ID: [name], female    DOB: [birth year]  Age: 73 y.o. MRN: [id number]  Chief Complaint  Patient presents with   Follow-up    Discuss mammogram and dexa scan.  Follow up for HTN.     HPI  [name] is a 73 year old female with past medical history of hypertension, GERD, chronic insomnia, anxiety and depression presents today for follow-up.  Chronic kidney disease: patient had labs done on 09/16/23 which showed GFR of 41.58.  After many attempts we were not able to get in touch with the patient until today.  Today she reports that this chronic and that she was being seeing by nephrologist in . She does not recall who she was seeing.  She has not seen them in the last week.  She denies any chest pain, shortness of breath or difficulty breathing.  She denies any urinary symptoms.  She denies any blurred vision, headaches or unilateral weakness.  Prediabetes: Updated patient regarding her hemoglobin A1c of 5.8.  She reports that her husband passed a year ago and she has been having a hard time.   Hypertension: She was restarted on metoprolol  25 mg once daily on 09/16/2023.  She was given a blood pressure log to check her blood pressures at home however she did not bring that back today.  She denies any blurred vision, headaches, chest pain, shortness of breath or unilateral weakness.  Her medications taking her medication as prescribed.  Hyperlipidemia: She has a history of hyperlipidemia and is currently managed on omega-3 fish oil once daily.  She has statin intolerance as it has caused severe myalgias in the past.  She does not recall which statin she has tried in the past however she does remember trying multiple ones.  She would like a referral order for mammogram and bone density as she is due.    Patient Active Problem List   Diagnosis Date Noted   Stage 3b chronic kidney disease (HCC) 10/14/2023   Statin intolerance  10/14/2023   Chronic midline low back pain 09/16/2023   Chronic insomnia 09/16/2023   Anxiety and depression 09/16/2023   Gastroesophageal reflux disease 09/16/2023   Abdominal spasms 09/16/2023   Mixed hyperlipidemia 09/16/2023   Arthritis 09/15/2023   Hypertension 09/15/2023   Papilloma of right breast 08/04/2016   Past Medical History:  Diagnosis Date   Anxiety    Arthritis    oa   Complication of anesthesia    Depression    GERD (gastroesophageal reflux disease)    Hypertension    Papilloma of right breast 08/04/2016   Past Surgical History:  Procedure Laterality Date   ABDOMINAL HYSTERECTOMY     APPENDECTOMY     BREAST BIOPSY Right 05/11/2016   right stereo for calcs   BREAST LUMPECTOMY WITH RADIOACTIVE SEED LOCALIZATION Right 08/04/2016   Procedure: RIGHT BREAST LUMPECTOMY WITH RADIOACTIVE SEED LOCALIZATION;  Surgeon: Boyce Byes, MD;  Location: [facility] OR;  Service: General;  Laterality: Right;   CHOLECYSTECTOMY     Allergies  Allergen Reactions   Erythromycin Other (See Comments)    Severe abdominal pain.   Valium [Diazepam] Other (See Comments)    Mood disturbances/combative behaviors.   Venlafaxine Rash         10/14/2023   11:51 AM 09/16/2023   11:49 AM  Depression screen PHQ 2/9  Decreased Interest 1 0  Down, Depressed, Hopeless 1 0  PHQ - 2 Score  2 0  Altered sleeping 3 2  Tired, decreased energy 1 0  Change in appetite 1 0  Feeling bad or failure about yourself  0 0  Trouble concentrating 1 0  Moving slowly or fidgety/restless 0 0  Suicidal thoughts 0 0  PHQ-9 Score 8 2  Difficult doing work/chores Somewhat difficult Somewhat difficult       10/14/2023   11:52 AM 09/16/2023   11:50 AM  GAD 7 : Generalized Anxiety Score  Nervous, Anxious, on Edge 1 0  Control/stop worrying  0  Worry too much - different things  0  Trouble relaxing 1 1  Restless 1 0  Easily annoyed or irritable  1  Afraid - awful might happen  0  Total GAD 7 Score  2  Anxiety  Difficulty  Not difficult at all      Review of Systems  Constitutional:  Negative for chills and fever.  Respiratory:  Negative for shortness of breath.   Cardiovascular:  Negative for chest pain.  Gastrointestinal:  Negative for abdominal pain, constipation, diarrhea, heartburn, nausea and vomiting.  Genitourinary:  Negative for dysuria, frequency and urgency.  Neurological:  Negative for dizziness and headaches.  Endo/Heme/Allergies:  Negative for polydipsia.  Psychiatric/Behavioral:  Negative for depression and suicidal ideas. The patient is not nervous/anxious.       Objective:     BP 124/72   Pulse 60   Temp 98 F (36.7 C) (Oral)   Ht 5\' 2"  (1.575 m)   Wt 140 lb 6.4 oz (63.7 kg)   SpO2 97%   BMI 25.68 kg/m  BP Readings from Last 3 Encounters:  10/14/23 124/72  09/16/23 (!) 150/70  05/19/18 (!) 158/78   Wt Readings from Last 3 Encounters:  10/14/23 140 lb 6.4 oz (63.7 kg)  09/16/23 138 lb (62.6 kg)  05/19/18 140 lb (63.5 kg)      Physical Exam Vitals and nursing note reviewed.  Constitutional:      Appearance: Normal appearance.  Cardiovascular:     Rate and Rhythm: Normal rate and regular rhythm.     Pulses: Normal pulses.     Heart sounds: Normal heart sounds.  Pulmonary:     Effort: Pulmonary effort is normal.     Breath sounds: Normal breath sounds.  Neurological:     Mental Status: She is alert and oriented to person, place, and time.  Psychiatric:        Mood and Affect: Mood normal.        Behavior: Behavior normal.        Thought Content: Thought content normal.        Judgment: Judgment normal.      No results found for any visits on 10/14/23.     The ASCVD Risk score (Arnett DK, et al., 2019) failed to calculate for the following reasons:   The valid total cholesterol range is 130 to 320 mg/dL    Assessment & Plan:  Primary hypertension Assessment & Plan: At goal today. Continue metoprolol  25 mg once daily. BMP  pending.   Gastroesophageal reflux disease, unspecified whether esophagitis present Assessment & Plan: Controlled. Continue Protonix  40 mg twice daily.   Anxiety and depression Assessment & Plan: Controlled. Continue sertraline  100 mg once daily.  Follow-up in 2 weeks.   Stage 3b chronic kidney disease (HCC) Assessment & Plan: Acute on chronic. Asymptomatic.  Stable for outpatient treatment. Plan number provided to patient to call and schedule appointment with nephrologist as it was  found in Care Everywhere. Discussed increasing water intake. Will monitor closely. Repeat BMP pending. Follow-up in 2 weeks.  Orders: -     Ambulatory referral to Nephrology -     Basic metabolic panel with GFR  Statin intolerance Assessment & Plan: Does not recall which statin but has failed multiple statins due to myalgias.   Mixed hyperlipidemia Assessment & Plan: Chronic.  Continue omega-3 once daily. Statin intolerance. Start ezetimibe 10 mg once daily. Repeat lipid panel in 6 months. Follow-up in 2 weeks.  Orders: -     Ezetimibe; Take 1 tablet (10 mg total) by mouth daily.  Dispense: 90 tablet; Refill: 0  Chronic insomnia Assessment & Plan: Chronic and controlled. Discussed at length and patient will continue trazodone  150 mg at bedtime.  Orders: -     traZODone  HCl; Take 1 tablet (150 mg total) by mouth at bedtime.  Encounter for screening mammogram for malignant neoplasm of breast -     3D Screening Mammogram, Left and Right; Future  Postmenopausal -     DG Bone Density; Future     Return in about 2 weeks (around 10/28/2023) for chronic kidney disease .    Jolanda Nation, NP

## 2023-10-14 NOTE — Assessment & Plan Note (Signed)
 Chronic.  Continue omega-3 once daily. Statin intolerance. Start ezetimibe 10 mg once daily. Repeat lipid panel in 6 months. Follow-up in 2 weeks.

## 2023-10-14 NOTE — Patient Instructions (Addendum)
 Stop by the lab prior to leaving today. I will notify you of your results once received.   Continue medication as prescribed.   Your blood sugar levels are in the prediabetes range.  Prediabetes means that you do not have diabetes, but you are at risk for developing diabetes if you do not work to reduce your blood sugar levels.   Decrease consumption of fast food, fried food, processed carbohydrates (chips, cookies, etc), sugary drinks, sweets. Increase consumption of fresh fruits and vegetables, whole grains, water.   You should be getting 150 minutes of moderate intensity exercise weekly.  We will continue to monitor these levels in the future.  Please call and schedule appt.   [facility]  [address]  [name]  [place], [place] [address]  [phone]    Your doctor's name was Valentin Gaskins.   You have an order for:  []   2D Mammogram  [x]   3D Mammogram  [x]   Bone Density     Please call for appointment:  [facility]  [address] [place] [address] [phone] [facility] [address] [place], [place] [address] [phone] [facility] [address]. [name] [place], [place] [address] [phone]   Make sure to wear two-piece clothing.  No lotions, powders, or deodorants the day of the appointment. Make sure to bring picture ID and insurance card.  Bring list of medications you are currently taking including any supplements.   Schedule your [facility] screening mammogram through MyChart!   Log into your MyChart account.  Go to 'Visit' (or 'Appointments' if on mobile App) --> Schedule an Appointment  Under 'Select a Reason for Visit' choose the Mammogram Screening option.  Complete the pre-visit questions and select the time and place that best fits your schedule.    Follow up in 2 weeks.  It was a pleasure to see  you today!

## 2023-10-14 NOTE — Assessment & Plan Note (Signed)
 Does not recall which statin but has failed multiple statins due to myalgias.

## 2023-10-18 ENCOUNTER — Ambulatory Visit: Admitting: General Practice

## 2023-10-23 ENCOUNTER — Other Ambulatory Visit: Payer: Self-pay | Admitting: General Practice

## 2023-10-23 DIAGNOSIS — I10 Essential (primary) hypertension: Secondary | ICD-10-CM

## 2023-10-23 DIAGNOSIS — F419 Anxiety disorder, unspecified: Secondary | ICD-10-CM

## 2023-10-23 IMAGING — MR MR LUMBAR SPINE W/O CM
5 series · 30 of 48 positions shown · non-contrast
Comparison: None.

CLINICAL DATA: Low back pain with occasional bilateral leg pain and
numbness

EXAM:
MRI LUMBAR SPINE WITHOUT CONTRAST
TECHNIQUE: Multiplanar, multisequence MR imaging of the lumbar spine was
performed. No intravenous contrast was administered.

[Series 5: T2 · sagittal · 4.0mm · 0.81mm/px · 6 of 17 slices shown (1 of 2)]
[im 1/17]
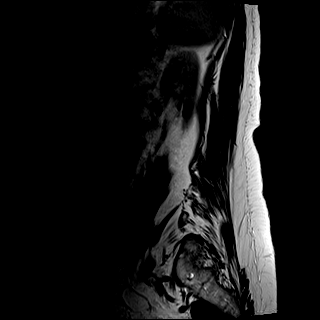
[im 4/17]
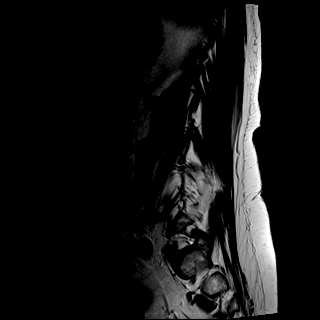
[im 7/17]
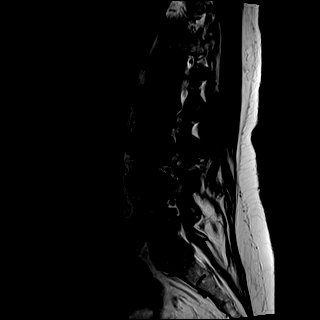
[im 10/17]
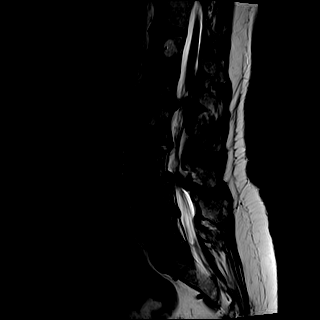
[im 13/17]
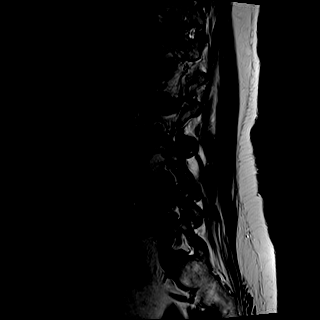
[im 17/17]
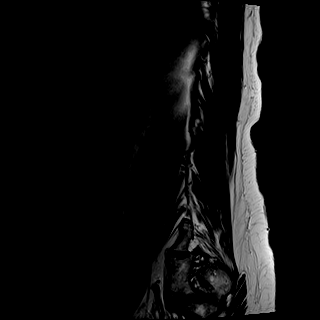

[Series 6: T1 · sagittal · 4.0mm · 0.81mm/px · 7 of 17 slices shown (1 of 2)]
[im 1/17]
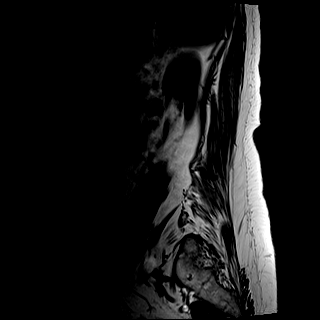
[im 3/17]
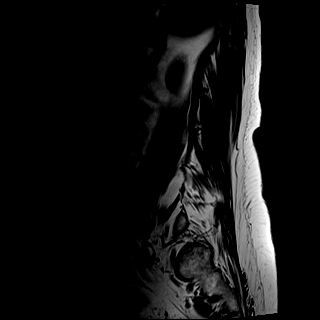
[im 6/17]
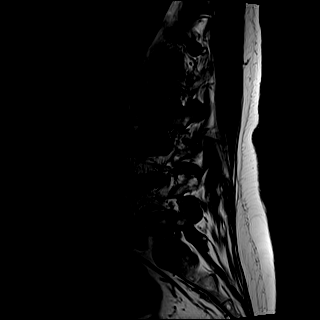
[im 9/17]
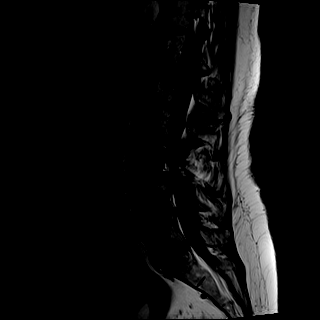
[im 11/17]
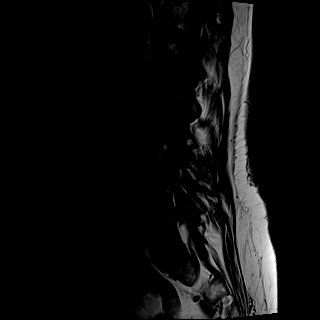
[im 14/17]
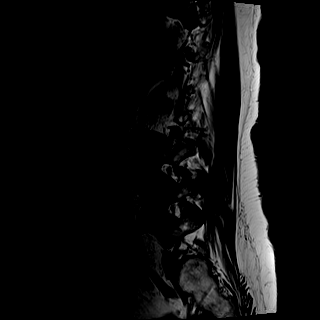
[im 17/17]
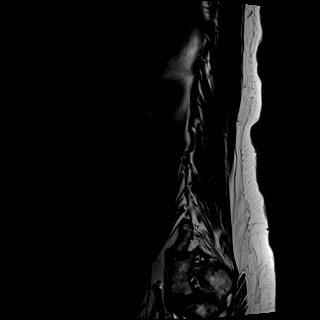

[Series 7: STIR · sagittal · 4.0mm · 0.41mm/px · 1 of 17 slices shown]
[im 1/17]
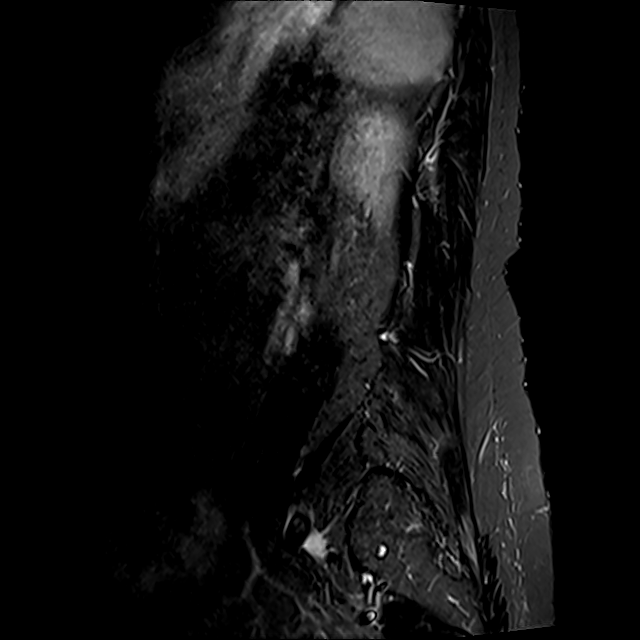

[Series 8: T2 · axial · 4.0mm · 0.78mm/px · z∈[-113,+87]mm · 8 of 34 slices shown (2 of 2)]
[im 1/34]
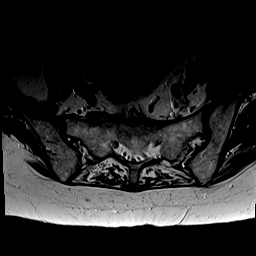
[im 6/34]
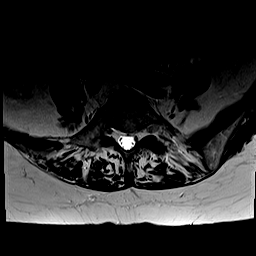
[im 11/34]
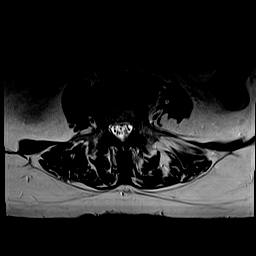
[im 16/34]
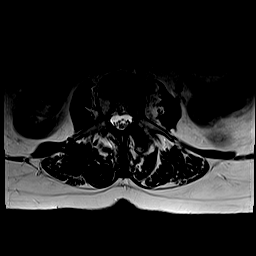
[im 18/34]
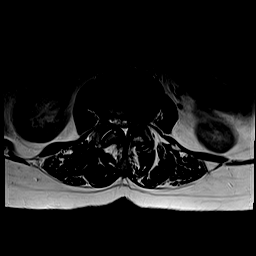
[im 23/34]
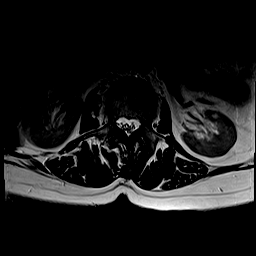
[im 28/34]
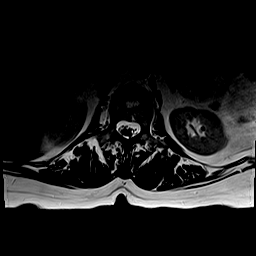
[im 34/34]
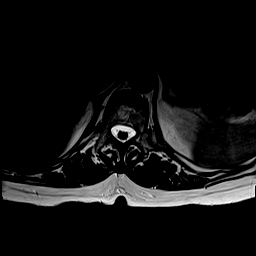

[Series 9: T1 · axial · 4.0mm · 0.39mm/px · z∈[-113,+87]mm · 8 of 34 slices shown (2 of 2)]
[im 1/34]
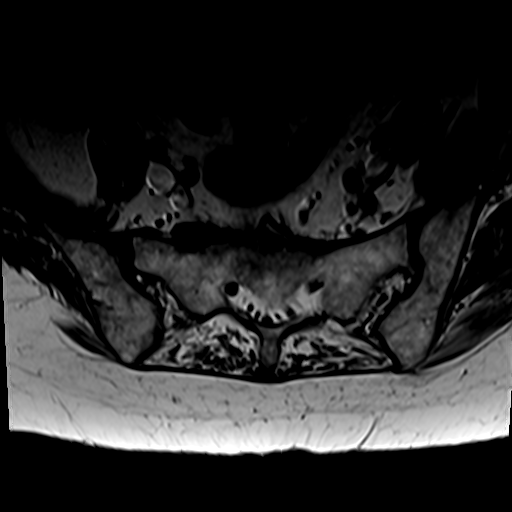
[im 6/34]
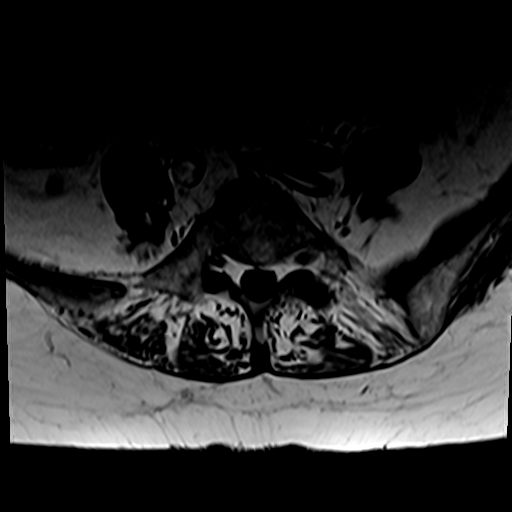
[im 11/34]
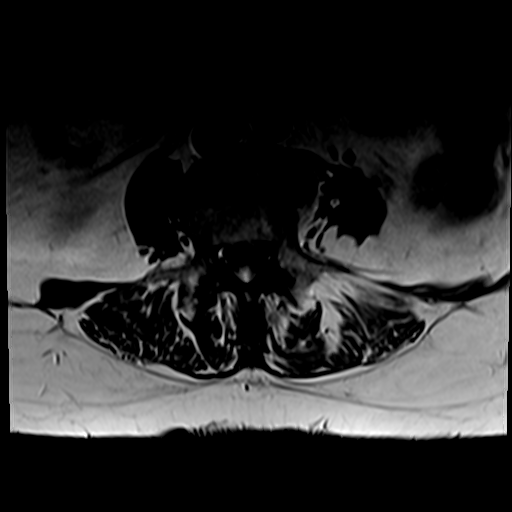
[im 16/34]
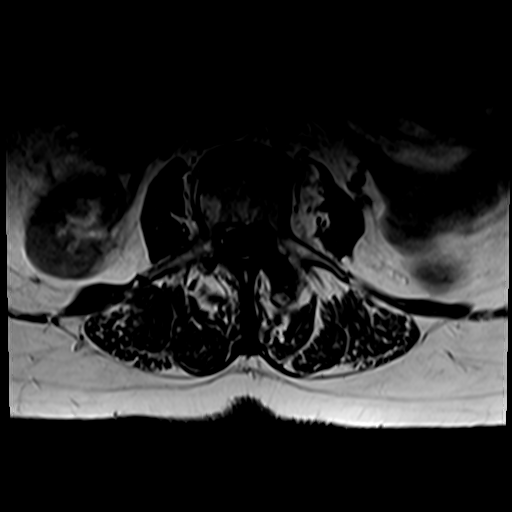
[im 18/34]
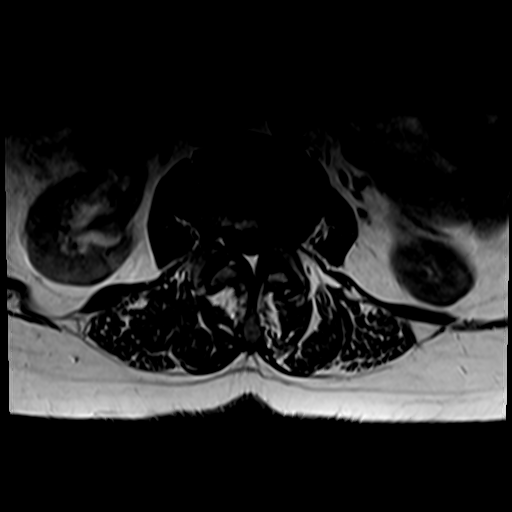
[im 23/34]
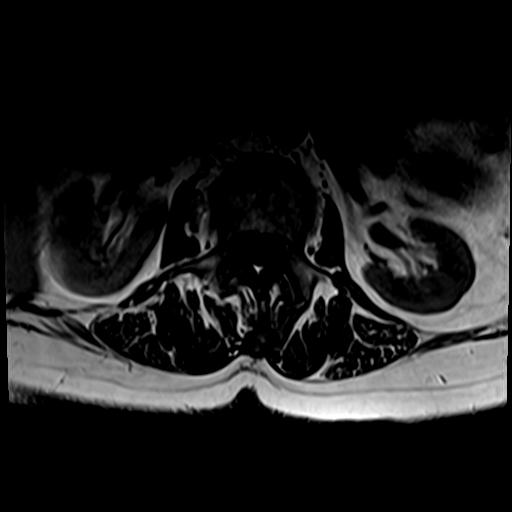
[im 28/34]
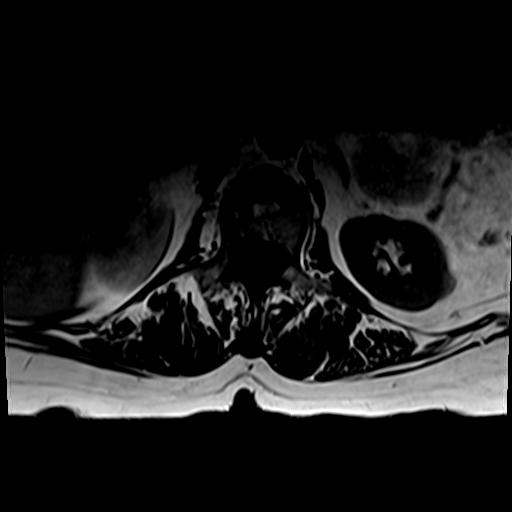
[im 34/34]
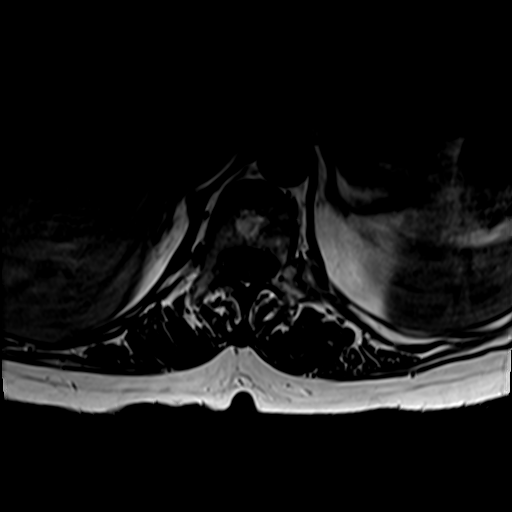

[30 of 48 positions shown; findings below may reference images not displayed]

FINDINGS: Segmentation:  Standard.

Alignment: S shaped curvature of the thoracolumbar spine, with
levocurvature of the thoracolumbar junction and dextrocurvature of
the lower lumbar spine. Grade 1 anterolisthesis of L3 on L4

Vertebrae: No acute fracture or suspicious osseous lesion. Endplate
degenerative changes, most prominent at L1-L2, which appear to be a
combination of Modic type 1 and type 2 endplate changes, eccentric
to the right aspect of the endplates. Additional T1 and T2
hyperintense lesions, most prominent in T12 and L3, are consistent
with benign hemangiomas.

Conus medullaris and cauda equina: Conus extends to the L1-L2 level.
Conus and cauda equina appear normal.

Paraspinal and other soft tissues: Small renal cysts.

Disc levels:

T12-L1: No significant disc bulge. Mild facet arthropathy. No spinal
canal stenosis or neural foraminal narrowing.

L1-L2: Disc height loss with broad-based disc bulge. Moderate facet
arthropathy. Mild spinal canal stenosis with narrowing of the
lateral recesses. Moderate right neural foraminal narrowing.

L2-L3: Disc height loss with left eccentric disc bulge. Moderate
facet arthropathy. Moderate spinal canal stenosis. Effacement of the
lateral recesses. Mild neural foraminal narrowing.

L3-L4: Grade 1 anterolisthesis with disc unroofing and broad-based
disc bulge. Severe facet arthropathy and ligamentum flavum
hypertrophy. Severe spinal canal stenosis and effacement of the
lateral recesses. No neural foraminal narrowing.

L4-L5: Disc height loss with broad-based disc bulge. Moderate to
severe facet arthropathy. No spinal canal stenosis. Narrowing of the
lateral recesses. Mild right neural foraminal narrowing.

L5-S1: No significant disc desiccation or disc bulge. Moderate left
and mild right facet arthropathy. No spinal canal stenosis or neural
foraminal narrowing.
IMPRESSION: 1. L3-L4 severe spinal canal stenosis. Effacement of the lateral
recesses at this level could affect the descending L4 nerves.
2. L2-L3 moderate spinal canal stenosis and mild bilateral neural
foraminal narrowing. Effacement of the lateral recesses at this
level could affect the descending L3 nerves.
3. L1-L2 mild spinal canal stenosis and moderate right neural
foraminal narrowing. Narrowing of the lateral recesses at this level
could affect the descending L2 nerves.
4. Narrowing of the lateral recesses at L4-L5 could affect the
descending L5 nerves.

## 2023-10-24 ENCOUNTER — Other Ambulatory Visit: Payer: Self-pay | Admitting: General Practice

## 2023-10-24 DIAGNOSIS — I10 Essential (primary) hypertension: Secondary | ICD-10-CM

## 2023-10-24 DIAGNOSIS — F419 Anxiety disorder, unspecified: Secondary | ICD-10-CM

## 2023-10-24 DIAGNOSIS — R109 Unspecified abdominal pain: Secondary | ICD-10-CM

## 2023-10-24 DIAGNOSIS — K219 Gastro-esophageal reflux disease without esophagitis: Secondary | ICD-10-CM

## 2023-10-24 NOTE — Telephone Encounter (Signed)
 LOV 10/14/23  Last refill 09/16/23 # 90 w. 0 refills   NOV 10/28/23

## 2023-10-26 ENCOUNTER — Telehealth: Payer: Self-pay

## 2023-10-26 ENCOUNTER — Other Ambulatory Visit (HOSPITAL_COMMUNITY): Payer: Self-pay

## 2023-10-26 NOTE — Telephone Encounter (Signed)
 Pharmacy Patient Advocate Encounter  Received notification from [facility] that Prior Authorization for Dicyclomine  HCl 10MG  capsules  has been APPROVED from 09/08/23 to 05/09/24. Unable to obtain price due to refill too soon rejection, last fill date 10/24/23 next available fill date07/20/25   PA #/Case ID/Reference #: [id number]

## 2023-10-28 ENCOUNTER — Ambulatory Visit: Admitting: General Practice

## 2023-10-28 DIAGNOSIS — I10 Essential (primary) hypertension: Secondary | ICD-10-CM

## 2023-10-28 DIAGNOSIS — N1832 Chronic kidney disease, stage 3b: Secondary | ICD-10-CM

## 2023-11-09 ENCOUNTER — Ambulatory Visit

## 2023-11-18 ENCOUNTER — Ambulatory Visit

## 2023-12-01 ENCOUNTER — Other Ambulatory Visit: Payer: Self-pay | Admitting: General Practice

## 2023-12-01 DIAGNOSIS — I10 Essential (primary) hypertension: Secondary | ICD-10-CM

## 2023-12-01 DIAGNOSIS — F419 Anxiety disorder, unspecified: Secondary | ICD-10-CM

## 2023-12-06 ENCOUNTER — Other Ambulatory Visit: Payer: Self-pay | Admitting: General Practice

## 2023-12-06 DIAGNOSIS — R109 Unspecified abdominal pain: Secondary | ICD-10-CM

## 2023-12-08 DIAGNOSIS — N1831 Chronic kidney disease, stage 3a: Secondary | ICD-10-CM | POA: Diagnosis not present

## 2023-12-08 DIAGNOSIS — I10 Essential (primary) hypertension: Secondary | ICD-10-CM | POA: Diagnosis not present

## 2023-12-22 DIAGNOSIS — Z008 Encounter for other general examination: Secondary | ICD-10-CM | POA: Diagnosis not present

## 2023-12-23 ENCOUNTER — Telehealth: Payer: Self-pay | Admitting: General Practice

## 2023-12-23 NOTE — Telephone Encounter (Signed)
 Patient is active with Carrol Aurora, NP.

## 2023-12-23 NOTE — Telephone Encounter (Signed)
 Copied from CRM #[id number]. Topic: General - Other >> Dec 23, 2023 12:35 PM Donna BRAVO wrote: Reason for CRM: Crystal verifying and confirming  if patient is active with Carrol Aurora NP  Will mail out Abnormal PAD reading

## 2023-12-28 ENCOUNTER — Other Ambulatory Visit: Payer: Self-pay | Admitting: General Practice

## 2023-12-28 DIAGNOSIS — K219 Gastro-esophageal reflux disease without esophagitis: Secondary | ICD-10-CM

## 2023-12-28 DIAGNOSIS — R109 Unspecified abdominal pain: Secondary | ICD-10-CM

## 2023-12-28 DIAGNOSIS — M545 Low back pain, unspecified: Secondary | ICD-10-CM

## 2023-12-28 DIAGNOSIS — E782 Mixed hyperlipidemia: Secondary | ICD-10-CM

## 2023-12-28 NOTE — Telephone Encounter (Signed)
 Patient was supposed to f/u in after her June appt and did not. Please call patient to schedule f/u before refills are sent.

## 2023-12-28 NOTE — Telephone Encounter (Signed)
 vm for pt to call office to schedule

## 2023-12-29 NOTE — Telephone Encounter (Unsigned)
 Copied from CRM #[id number]. Topic: Clinical - Medication Refill >> Dec 29, 2023 11:06 AM Kathrin PARAS wrote: Medication:  pantoprazole  (PROTONIX ) 40 MG tablet   Has the patient contacted their pharmacy? Yes (Agent: If no, request that the patient contact the pharmacy for the refill. If patient does not wish to contact the pharmacy document the reason why and proceed with request.) (Agent: If yes, when and what did the pharmacy advise?)  This is the patient's preferred pharmacy:  [facility] [phone], [place] - [address] [place] [address] Phone: [phone] Fax: [phone]  Is this the correct pharmacy for this prescription? Yes If no, delete pharmacy and type the correct one.   Has the prescription been filled recently? Yes  Is the patient out of the medication? Yes  Has the patient been seen for an appointment in the last year OR does the patient have an upcoming appointment? Yes  Can we respond through MyChart? Yes  Agent: Please be advised that Rx refills may take up to 3 business days. We ask that you follow-up with your pharmacy.

## 2023-12-29 NOTE — Telephone Encounter (Signed)
 lvm for pt to call office to schedule appt.

## 2023-12-29 NOTE — Telephone Encounter (Signed)
Please schedule patient appt.

## 2023-12-30 NOTE — Telephone Encounter (Signed)
 We have tried calling patient to schedule appt; okay to refill?

## 2023-12-30 NOTE — Telephone Encounter (Signed)
 lvm for pt to call office to schedule appt.

## 2024-01-02 NOTE — Telephone Encounter (Signed)
Patient scheduled appt for tomorrow.

## 2024-01-03 ENCOUNTER — Ambulatory Visit: Payer: Self-pay | Admitting: General Practice

## 2024-01-03 ENCOUNTER — Ambulatory Visit (INDEPENDENT_AMBULATORY_CARE_PROVIDER_SITE_OTHER): Admitting: General Practice

## 2024-01-03 ENCOUNTER — Encounter: Payer: Self-pay | Admitting: General Practice

## 2024-01-03 VITALS — BP 130/72 | HR 70 | Temp 97.9°F | Ht 62.0 in | Wt 150.0 lb

## 2024-01-03 DIAGNOSIS — G8929 Other chronic pain: Secondary | ICD-10-CM

## 2024-01-03 DIAGNOSIS — K219 Gastro-esophageal reflux disease without esophagitis: Secondary | ICD-10-CM | POA: Diagnosis not present

## 2024-01-03 DIAGNOSIS — E782 Mixed hyperlipidemia: Secondary | ICD-10-CM | POA: Diagnosis not present

## 2024-01-03 DIAGNOSIS — M545 Low back pain, unspecified: Secondary | ICD-10-CM | POA: Diagnosis not present

## 2024-01-03 DIAGNOSIS — R109 Unspecified abdominal pain: Secondary | ICD-10-CM

## 2024-01-03 DIAGNOSIS — I10 Essential (primary) hypertension: Secondary | ICD-10-CM

## 2024-01-03 DIAGNOSIS — F5104 Psychophysiologic insomnia: Secondary | ICD-10-CM | POA: Diagnosis not present

## 2024-01-03 DIAGNOSIS — F419 Anxiety disorder, unspecified: Secondary | ICD-10-CM

## 2024-01-03 DIAGNOSIS — F32A Depression, unspecified: Secondary | ICD-10-CM

## 2024-01-03 LAB — LIPID PANEL
Cholesterol: 277 mg/dL — ABNORMAL HIGH (ref 0–200)
HDL: 74.5 mg/dL (ref >39.00)
LDL Cholesterol: 161 mg/dL — ABNORMAL HIGH (ref 0–99)
NonHDL: 202.29
Total CHOL/HDL Ratio: 4
Triglycerides: 204 mg/dL — ABNORMAL HIGH (ref 0.0–149.0)
VLDL: 40.8 mg/dL — ABNORMAL HIGH (ref 0.0–40.0)

## 2024-01-03 LAB — COMPREHENSIVE METABOLIC PANEL WITH GFR
ALT: 25 U/L (ref 0–35)
AST: 29 U/L (ref 0–37)
Albumin: 4.5 g/dL (ref 3.5–5.2)
Alkaline Phosphatase: 80 U/L (ref 39–117)
BUN: 23 mg/dL (ref 6–23)
CO2: 28 meq/L (ref 19–32)
Calcium: 9.9 mg/dL (ref 8.4–10.5)
Chloride: 102 meq/L (ref 96–112)
Creatinine, Ser: 1.21 mg/dL — ABNORMAL HIGH (ref 0.40–1.20)
GFR: 43.97 mL/min — ABNORMAL LOW (ref >60.00)
Glucose, Bld: 92 mg/dL (ref 70–99)
Potassium: 4.2 meq/L (ref 3.5–5.1)
Sodium: 142 meq/L (ref 135–145)
Total Bilirubin: 0.5 mg/dL (ref 0.2–1.2)
Total Protein: 7.4 g/dL (ref 6.0–8.3)

## 2024-01-03 MED ORDER — METOPROLOL TARTRATE 25 MG PO TABS: 25.0000 mg | ORAL_TABLET | Freq: Two times a day (BID) | ORAL | 0 refills | Status: AC

## 2024-01-03 MED ORDER — SERTRALINE HCL 100 MG PO TABS
200.0000 mg | ORAL_TABLET | Freq: Every day | ORAL | 0 refills | Status: AC
Start: 2024-01-03 — End: ?

## 2024-01-03 MED ORDER — EZETIMIBE 10 MG PO TABS: 10.0000 mg | ORAL_TABLET | Freq: Every day | ORAL | 0 refills | Status: DC

## 2024-01-03 MED ORDER — AMLODIPINE BESYLATE 5 MG PO TABS: 5.0000 mg | ORAL_TABLET | Freq: Every day | ORAL | 2 refills | Status: AC

## 2024-01-03 MED ORDER — PANTOPRAZOLE SODIUM 40 MG PO TBEC
40.0000 mg | DELAYED_RELEASE_TABLET | Freq: Two times a day (BID) | ORAL | 2 refills | Status: DC
Start: 2024-01-03 — End: 2024-03-19

## 2024-01-03 MED ORDER — TIZANIDINE HCL 4 MG PO TABS: 4.0000 mg | ORAL_TABLET | Freq: Every day | ORAL | 0 refills | Status: DC

## 2024-01-03 MED ORDER — DICYCLOMINE HCL 10 MG PO CAPS: 10.0000 mg | ORAL_CAPSULE | Freq: Two times a day (BID) | ORAL | 0 refills | Status: DC | PRN

## 2024-01-03 NOTE — Progress Notes (Signed)
 Established Patient Office Visit  Subjective   Patient ID: [name], female    DOB: 08-18-1950  Age: 73 y.o. MRN: [id number]  Chief Complaint  Patient presents with   chronic care management    And med refills    HPI  Discussed the use of AI scribe software for clinical note transcription with the patient, who gave verbal consent to proceed.  History of Present Illness [name] is a 73 year old female who presents for a follow-up visit and medication refills.  She was last seen on June 6th, when she was started on ezetimibe  for cholesterol management. She has not experienced any issues with ezetimibe , such as muscle aches, which she previously experienced with statins.  She is currently taking trazodone  150 mg at bedtime for sleep. Although there was a previous discussion about discontinuing it, she prefers to continue as it helps her achieve longer, uninterrupted sleep. However, she notes waking up after two to three hours, which affects her rest.  Regarding her kidney function, she has been following up with a nephrologist. The nephrologist indicated her kidneys were fine, but her blood pressure was a concern, leading to the initiation of amlodipine  5 mg once daily. She is not scheduled for a follow-up with the nephrologist, but she monitors her blood pressure closely. She is also on metoprolol , which she takes twice daily.  Her blood pressure has improved since starting these medications.  She continues to take sertraline  100 mg daily, which she finds beneficial for her mood. She has been on it for a long time, initially due to stress related to caring for her husband, who passed away in 2024-05-06. She describes the loss as the hardest experience in her life, having been married for 55 years. She still adjusts to life without him, often waking up at night as she used to when caring for him.  She takes tizanidine  at night for degenerative disc disease, which helps her rest. She  organizes her medications weekly to avoid any dosing errors.  Her family history includes her mother having breast cancer at age 25. She has two sons, one of whom lives with her, and she helps care for her grandchildren.    Patient Active Problem List   Diagnosis Date Noted   Stage 3b chronic kidney disease (HCC) 10/14/2023   Statin intolerance 10/14/2023   Chronic midline low back pain 09/16/2023   Chronic insomnia 09/16/2023   Anxiety and depression 09/16/2023   Gastroesophageal reflux disease 09/16/2023   Abdominal spasms 09/16/2023   Mixed hyperlipidemia 09/16/2023   Arthritis 09/15/2023   Hypertension 09/15/2023   Papilloma of right breast 08/04/2016   Past Medical History:  Diagnosis Date   Anxiety    Arthritis    oa   Complication of anesthesia    Depression    GERD (gastroesophageal reflux disease)    Hypertension    Papilloma of right breast 08/04/2016   Past Surgical History:  Procedure Laterality Date   ABDOMINAL HYSTERECTOMY     APPENDECTOMY     BREAST BIOPSY Right 05/11/2016   right stereo for calcs   BREAST LUMPECTOMY WITH RADIOACTIVE SEED LOCALIZATION Right 08/04/2016   Procedure: RIGHT BREAST LUMPECTOMY WITH RADIOACTIVE SEED LOCALIZATION;  Surgeon: Elon Pacini, MD;  Location: [facility] OR;  Service: General;  Laterality: Right;   CHOLECYSTECTOMY     Allergies  Allergen Reactions   Erythromycin Other (See Comments)    Severe abdominal pain.   Valium [Diazepam] Other (See Comments)  Mood disturbances/combative behaviors.   Venlafaxine Rash         01/03/2024   10:22 AM 10/14/2023   11:51 AM 09/16/2023   11:49 AM  Depression screen PHQ 2/9  Decreased Interest 1 1 0  Down, Depressed, Hopeless 0 1 0  PHQ - 2 Score 1 2 0  Altered sleeping 1 3 2   Tired, decreased energy 1 1 0  Change in appetite 0 1 0  Feeling bad or failure about yourself  0 0 0  Trouble concentrating 0 1 0  Moving slowly or fidgety/restless 0 0 0  Suicidal thoughts 0 0 0  PHQ-9  Score 3 8 2   Difficult doing work/chores Somewhat difficult Somewhat difficult Somewhat difficult       01/03/2024   10:22 AM 10/14/2023   11:52 AM 09/16/2023   11:50 AM  GAD 7 : Generalized Anxiety Score  Nervous, Anxious, on Edge 0 1 0  Control/stop worrying 0  0  Worry too much - different things 1  0  Trouble relaxing 1 1 1   Restless 0 1 0  Easily annoyed or irritable 0  1  Afraid - awful might happen 0  0  Total GAD 7 Score 2  2  Anxiety Difficulty Somewhat difficult  Not difficult at all      Review of Systems  Constitutional:  Negative for chills and fever.  Respiratory:  Negative for shortness of breath.   Cardiovascular:  Negative for chest pain.  Gastrointestinal:  Negative for abdominal pain, constipation, diarrhea, heartburn, nausea and vomiting.  Genitourinary:  Negative for dysuria, frequency and urgency.  Neurological:  Negative for dizziness and headaches.  Endo/Heme/Allergies:  Negative for polydipsia.  Psychiatric/Behavioral:  Negative for depression and suicidal ideas. The patient is not nervous/anxious.       Objective:     BP 130/72   Pulse 70   Temp 97.9 F (36.6 C) (Oral)   Ht 5' 2 (1.575 m)   Wt 150 lb (68 kg)   SpO2 98%   BMI 27.44 kg/m  BP Readings from Last 3 Encounters:  01/03/24 130/72  10/14/23 124/72  09/16/23 (!) 150/70   Wt Readings from Last 3 Encounters:  01/03/24 150 lb (68 kg)  10/14/23 140 lb 6.4 oz (63.7 kg)  09/16/23 138 lb (62.6 kg)      Physical Exam Vitals and nursing note reviewed.  Constitutional:      Appearance: Normal appearance.  Cardiovascular:     Rate and Rhythm: Normal rate and regular rhythm.     Pulses: Normal pulses.     Heart sounds: Normal heart sounds.  Pulmonary:     Effort: Pulmonary effort is normal.     Breath sounds: Normal breath sounds.  Neurological:     Mental Status: She is alert and oriented to person, place, and time.  Psychiatric:        Mood and Affect: Mood normal.         Behavior: Behavior normal.        Thought Content: Thought content normal.        Judgment: Judgment normal.      No results found for any visits on 01/03/24.     The ASCVD Risk score (Arnett DK, et al., 2019) failed to calculate for the following reasons:   The valid total cholesterol range is 130 to 320 mg/dL    Assessment & Plan:  Primary hypertension -     Metoprolol  Tartrate; Take 1 tablet (25 mg  total) by mouth 2 (two) times daily.  Dispense: 180 tablet; Refill: 0 -     Comprehensive metabolic panel with GFR -     amLODIPine  Besylate; Take 1 tablet (5 mg total) by mouth daily.  Dispense: 30 tablet; Refill: 2  Mixed hyperlipidemia -     Ezetimibe ; Take 1 tablet (10 mg total) by mouth daily.  Dispense: 90 tablet; Refill: 0 -     Lipid panel  Gastroesophageal reflux disease, unspecified whether esophagitis present -     Pantoprazole  Sodium; Take 1 tablet (40 mg total) by mouth 2 (two) times daily.  Dispense: 60 tablet; Refill: 2  Chronic insomnia  Chronic midline low back pain, unspecified whether sciatica present -     tiZANidine  HCl; Take 1 tablet (4 mg total) by mouth at bedtime.  Dispense: 90 tablet; Refill: 0  Anxiety and depression -     Sertraline  HCl; Take 2 tablets (200 mg total) by mouth daily.  Dispense: 180 tablet; Refill: 0  Abdominal spasms -     Dicyclomine  HCl; Take 1 capsule (10 mg total) by mouth 2 (two) times daily as needed for spasms.  Dispense: 90 capsule; Refill: 0    Assessment and Plan Assessment & Plan Primary hypertension Blood pressure improved but remains elevated. Managed with metoprolol  and amlodipine . - Continue metoprolol  twice daily. - Continue amlodipine  once daily. - Monitor and record blood pressure readings. - BMP pending.  Stage 3b chronic kidney disease Kidney function stable per nephrology consultation, hypertension remains a concern. - BMP pending. - Follow up with nephrologist if kidney function deteriorates.  Mixed  hyperlipidemia with statin intolerance Managed with ezetimibe  due to statin intolerance. - Continue ezetimibe . - Lipid panel pending.  Chronic insomnia Improved sleep duration with trazodone  150 mg at bedtime. - Continue trazodone  150 mg at bedtime.  Depression and anxiety Mood improved with sertraline  100 mg daily. Recent bereavement, coping with family support. - Continue sertraline  100 mg daily. - declines therapy.  General Health Maintenance Bone density and mammogram screenings due. Family history of breast cancer.  - Provide contact information for bone density and mammogram appointments.    Return in about 6 months (around 07/05/2024) for chronic care management.SABRA Carrol Aurora, NP

## 2024-01-03 NOTE — Patient Instructions (Addendum)
 Stop by the lab prior to leaving today. I will notify you of your results once received.   Continue medication as prescribed.   Call and schedule mammogram and bone density.   Follow up in 6 months.   It was a pleasure to see you today!

## 2024-03-17 ENCOUNTER — Other Ambulatory Visit: Payer: Self-pay | Admitting: General Practice

## 2024-03-17 DIAGNOSIS — F5104 Psychophysiologic insomnia: Secondary | ICD-10-CM

## 2024-03-17 DIAGNOSIS — G8929 Other chronic pain: Secondary | ICD-10-CM

## 2024-03-17 DIAGNOSIS — E782 Mixed hyperlipidemia: Secondary | ICD-10-CM

## 2024-03-17 DIAGNOSIS — K219 Gastro-esophageal reflux disease without esophagitis: Secondary | ICD-10-CM

## 2024-05-02 ENCOUNTER — Ambulatory Visit: Payer: Self-pay

## 2024-05-02 ENCOUNTER — Other Ambulatory Visit: Payer: Self-pay

## 2024-05-02 ENCOUNTER — Encounter: Payer: Self-pay | Admitting: Internal Medicine

## 2024-05-02 ENCOUNTER — Emergency Department

## 2024-05-02 ENCOUNTER — Inpatient Hospital Stay
Admission: EM | Admit: 2024-05-02 | Discharge: 2024-05-07 | DRG: 871 | Disposition: A | Discharge status: Home / Self Care | Attending: Hospitalist | Admitting: Hospitalist

## 2024-05-02 DIAGNOSIS — I129 Hypertensive chronic kidney disease with stage 1 through stage 4 chronic kidney disease, or unspecified chronic kidney disease: Secondary | ICD-10-CM | POA: Diagnosis present

## 2024-05-02 DIAGNOSIS — Z885 Allergy status to narcotic agent status: Secondary | ICD-10-CM | POA: Diagnosis not present

## 2024-05-02 DIAGNOSIS — Z888 Allergy status to other drugs, medicaments and biological substances status: Secondary | ICD-10-CM | POA: Diagnosis not present

## 2024-05-02 DIAGNOSIS — Z79899 Other long term (current) drug therapy: Secondary | ICD-10-CM | POA: Diagnosis not present

## 2024-05-02 DIAGNOSIS — F5104 Psychophysiologic insomnia: Secondary | ICD-10-CM | POA: Diagnosis present

## 2024-05-02 DIAGNOSIS — Z881 Allergy status to other antibiotic agents status: Secondary | ICD-10-CM | POA: Diagnosis not present

## 2024-05-02 DIAGNOSIS — Z7982 Long term (current) use of aspirin: Secondary | ICD-10-CM

## 2024-05-02 DIAGNOSIS — J9601 Acute respiratory failure with hypoxia: Secondary | ICD-10-CM | POA: Diagnosis present

## 2024-05-02 DIAGNOSIS — N1832 Chronic kidney disease, stage 3b: Secondary | ICD-10-CM | POA: Diagnosis present

## 2024-05-02 DIAGNOSIS — K219 Gastro-esophageal reflux disease without esophagitis: Secondary | ICD-10-CM | POA: Diagnosis present

## 2024-05-02 DIAGNOSIS — Z90710 Acquired absence of both cervix and uterus: Secondary | ICD-10-CM

## 2024-05-02 DIAGNOSIS — R6520 Severe sepsis without septic shock: Secondary | ICD-10-CM | POA: Diagnosis present

## 2024-05-02 DIAGNOSIS — Z1152 Encounter for screening for COVID-19: Secondary | ICD-10-CM | POA: Diagnosis not present

## 2024-05-02 DIAGNOSIS — I10 Essential (primary) hypertension: Secondary | ICD-10-CM | POA: Diagnosis not present

## 2024-05-02 DIAGNOSIS — J189 Pneumonia, unspecified organism: Secondary | ICD-10-CM | POA: Diagnosis present

## 2024-05-02 DIAGNOSIS — F32A Depression, unspecified: Secondary | ICD-10-CM | POA: Diagnosis present

## 2024-05-02 DIAGNOSIS — N179 Acute kidney failure, unspecified: Secondary | ICD-10-CM | POA: Diagnosis present

## 2024-05-02 DIAGNOSIS — Z789 Other specified health status: Secondary | ICD-10-CM | POA: Diagnosis present

## 2024-05-02 DIAGNOSIS — Z9049 Acquired absence of other specified parts of digestive tract: Secondary | ICD-10-CM | POA: Diagnosis not present

## 2024-05-02 DIAGNOSIS — R509 Fever, unspecified: Secondary | ICD-10-CM

## 2024-05-02 DIAGNOSIS — Z634 Disappearance and death of family member: Secondary | ICD-10-CM | POA: Diagnosis not present

## 2024-05-02 DIAGNOSIS — A419 Sepsis, unspecified organism: Secondary | ICD-10-CM | POA: Diagnosis present

## 2024-05-02 DIAGNOSIS — E782 Mixed hyperlipidemia: Secondary | ICD-10-CM | POA: Diagnosis present

## 2024-05-02 DIAGNOSIS — F419 Anxiety disorder, unspecified: Secondary | ICD-10-CM | POA: Diagnosis present

## 2024-05-02 LAB — COMPREHENSIVE METABOLIC PANEL WITH GFR
ALT: 16 U/L (ref 0–44)
AST: 30 U/L (ref 15–41)
Albumin: 4.2 g/dL (ref 3.5–5.0)
Alkaline Phosphatase: 95 U/L (ref 38–126)
Anion gap: 13 (ref 5–15)
BUN: 18 mg/dL (ref 8–23)
CO2: 22 mmol/L (ref 22–32)
Calcium: 8.9 mg/dL (ref 8.9–10.3)
Chloride: 100 mmol/L (ref 98–111)
Creatinine, Ser: 1.26 mg/dL — ABNORMAL HIGH (ref 0.44–1.00)
GFR, Estimated: 44 mL/min — ABNORMAL LOW
Glucose, Bld: 137 mg/dL — ABNORMAL HIGH (ref 70–99)
Potassium: 4 mmol/L (ref 3.5–5.1)
Sodium: 135 mmol/L (ref 135–145)
Total Bilirubin: 0.4 mg/dL (ref 0.0–1.2)
Total Protein: 7.3 g/dL (ref 6.5–8.1)

## 2024-05-02 LAB — CBC
HCT: 37.4 % (ref 36.0–46.0)
Hemoglobin: 12.3 g/dL (ref 12.0–15.0)
MCH: 30.1 pg (ref 26.0–34.0)
MCHC: 32.9 g/dL (ref 30.0–36.0)
MCV: 91.7 fL (ref 80.0–100.0)
Platelets: 157 K/uL (ref 150–400)
RBC: 4.08 MIL/uL (ref 3.87–5.11)
RDW: 12.5 % (ref 11.5–15.5)
WBC: 8.9 K/uL (ref 4.0–10.5)
nRBC: 0 % (ref 0.0–0.2)

## 2024-05-02 LAB — RESP PANEL BY RT-PCR (RSV, FLU A&B, COVID)  RVPGX2
Influenza A by PCR: NEGATIVE
Influenza B by PCR: NEGATIVE
Resp Syncytial Virus by PCR: NEGATIVE
SARS Coronavirus 2 by RT PCR: NEGATIVE

## 2024-05-02 LAB — TROPONIN T, HIGH SENSITIVITY
Troponin T High Sensitivity: <15 ng/L (ref 0–19)
Troponin T High Sensitivity: <15 ng/L (ref 0–19)

## 2024-05-02 LAB — LACTIC ACID, PLASMA: Lactic Acid, Venous: 1 mmol/L (ref 0.5–1.9)

## 2024-05-02 MED ORDER — IPRATROPIUM-ALBUTEROL 0.5-2.5 (3) MG/3ML IN SOLN
3.0000 mL | Freq: Four times a day (QID) | RESPIRATORY_TRACT | Status: DC | PRN
  Administered 2024-05-02 – 2024-05-03 (×3): 3 mL via RESPIRATORY_TRACT
  Filled 2024-05-02 (×4): qty 3

## 2024-05-02 MED ORDER — KETOROLAC TROMETHAMINE 15 MG/ML IJ SOLN
15.0000 mg | Freq: Four times a day (QID) | INTRAMUSCULAR | Status: DC | PRN
  Administered 2024-05-02 – 2024-05-03 (×3): 15 mg via INTRAVENOUS
  Filled 2024-05-02 (×4): qty 1

## 2024-05-02 MED ORDER — SODIUM CHLORIDE 0.9 % IV SOLN
500.0000 mg | INTRAVENOUS | Status: DC
  Administered 2024-05-02 – 2024-05-03 (×2): 500 mg via INTRAVENOUS
  Filled 2024-05-02 (×3): qty 5

## 2024-05-02 MED ORDER — LACTATED RINGERS IV SOLN
150.0000 mL/h | INTRAVENOUS | Status: DC
  Administered 2024-05-02: 150 mL/h via INTRAVENOUS

## 2024-05-02 MED ORDER — ONDANSETRON HCL 4 MG/2ML IJ SOLN
4.0000 mg | Freq: Once | INTRAMUSCULAR | Status: AC
  Administered 2024-05-02: 4 mg via INTRAVENOUS
  Filled 2024-05-02: qty 2

## 2024-05-02 MED ORDER — HYDRALAZINE HCL 20 MG/ML IJ SOLN: 5.0000 mg | Freq: Four times a day (QID) | INTRAMUSCULAR | Status: DC | PRN

## 2024-05-02 MED ORDER — PANTOPRAZOLE SODIUM 40 MG PO TBEC
40.0000 mg | DELAYED_RELEASE_TABLET | Freq: Two times a day (BID) | ORAL | Status: DC
  Administered 2024-05-02 – 2024-05-07 (×10): 40 mg via ORAL
  Filled 2024-05-02 (×9): qty 1

## 2024-05-02 MED ORDER — ACETAMINOPHEN 650 MG RE SUPP: 650.0000 mg | Freq: Four times a day (QID) | RECTAL | Status: DC | PRN

## 2024-05-02 MED ORDER — SODIUM CHLORIDE 0.9 % IV SOLN
2.0000 g | INTRAVENOUS | Status: AC
  Administered 2024-05-02 – 2024-05-06 (×5): 2 g via INTRAVENOUS
  Filled 2024-05-02 (×5): qty 20

## 2024-05-02 MED ORDER — SENNOSIDES-DOCUSATE SODIUM 8.6-50 MG PO TABS: 1.0000 | ORAL_TABLET | Freq: Every evening | ORAL | Status: DC | PRN

## 2024-05-02 MED ORDER — SODIUM CHLORIDE 0.9 % IV BOLUS
500.0000 mL | Freq: Once | INTRAVENOUS | Status: AC
  Administered 2024-05-02: 500 mL via INTRAVENOUS

## 2024-05-02 MED ORDER — ACETAMINOPHEN 500 MG PO TABS
1000.0000 mg | ORAL_TABLET | Freq: Once | ORAL | Status: AC
  Administered 2024-05-02: 1000 mg via ORAL
  Filled 2024-05-02: qty 2

## 2024-05-02 MED ORDER — FUROSEMIDE 10 MG/ML IJ SOLN
20.0000 mg | Freq: Once | INTRAMUSCULAR | Status: AC
  Administered 2024-05-02: 20 mg via INTRAVENOUS
  Filled 2024-05-02: qty 2

## 2024-05-02 MED ORDER — ACETAMINOPHEN 325 MG PO TABS
650.0000 mg | ORAL_TABLET | Freq: Four times a day (QID) | ORAL | Status: DC | PRN
  Administered 2024-05-02 – 2024-05-07 (×8): 650 mg via ORAL
  Filled 2024-05-02 (×7): qty 2

## 2024-05-02 MED ORDER — AMLODIPINE BESYLATE 5 MG PO TABS
5.0000 mg | ORAL_TABLET | Freq: Every day | ORAL | Status: DC
  Administered 2024-05-03 – 2024-05-07 (×5): 5 mg via ORAL
  Filled 2024-05-02 (×4): qty 1

## 2024-05-02 MED ORDER — ONDANSETRON HCL 4 MG/2ML IJ SOLN: 4.0000 mg | Freq: Four times a day (QID) | INTRAMUSCULAR | Status: DC | PRN

## 2024-05-02 MED ORDER — ONDANSETRON HCL 4 MG PO TABS: 4.0000 mg | ORAL_TABLET | Freq: Four times a day (QID) | ORAL | Status: DC | PRN

## 2024-05-02 MED ORDER — LIDOCAINE 5 % EX PTCH
1.0000 | MEDICATED_PATCH | CUTANEOUS | Status: DC
  Administered 2024-05-02 – 2024-05-06 (×5): 1 via TRANSDERMAL
  Filled 2024-05-02 (×5): qty 1

## 2024-05-02 MED ORDER — TRAZODONE HCL 50 MG PO TABS
150.0000 mg | ORAL_TABLET | Freq: Every day | ORAL | Status: DC
  Administered 2024-05-02 – 2024-05-06 (×5): 150 mg via ORAL
  Filled 2024-05-02 (×5): qty 3

## 2024-05-02 MED ORDER — EZETIMIBE 10 MG PO TABS
10.0000 mg | ORAL_TABLET | Freq: Every day | ORAL | Status: DC
  Administered 2024-05-02 – 2024-05-06 (×5): 10 mg via ORAL
  Filled 2024-05-02 (×5): qty 1

## 2024-05-02 MED ORDER — ENOXAPARIN SODIUM 40 MG/0.4ML IJ SOSY
40.0000 mg | PREFILLED_SYRINGE | INTRAMUSCULAR | Status: DC
  Administered 2024-05-02 – 2024-05-06 (×5): 40 mg via SUBCUTANEOUS
  Filled 2024-05-02 (×5): qty 0.4

## 2024-05-02 MED ORDER — SERTRALINE HCL 50 MG PO TABS
200.0000 mg | ORAL_TABLET | Freq: Every day | ORAL | Status: DC
  Administered 2024-05-03 – 2024-05-07 (×5): 200 mg via ORAL
  Filled 2024-05-02 (×4): qty 4

## 2024-05-02 MED ORDER — LACTATED RINGERS IV BOLUS (SEPSIS)
500.0000 mL | Freq: Once | INTRAVENOUS | Status: AC
  Administered 2024-05-02: 500 mL via INTRAVENOUS

## 2024-05-02 NOTE — Telephone Encounter (Signed)
 Copied from CRM #[id number]. Topic: General - Other >> May 02, 2024  9:32 AM Anairis L wrote: Reason for CRM: Patient calling in to inform us  she is going in now to the ER after being suggested yesterday by our staff.

## 2024-05-02 NOTE — ED Triage Notes (Signed)
 C/O elevated BP today and felt like she was going to pass out.  Symptoms started this morning.  Currently c/o feeling nauseated.  AAOx3. Skin warm and dry. NAD

## 2024-05-02 NOTE — Progress Notes (Signed)
 Pt oxygen was at 81-82 % RA and has a LR running at 150 ml/hr. Per pt report she's having SOB. Pt has a diminished clear lung sounds. Pt was placed on 3.5 liters oxygen and sating at 92 %. MD Mansy made aware.  Update 2340: See new orders.

## 2024-05-02 NOTE — Assessment & Plan Note (Signed)
-   Ezetimibe 10 mg daily resumed 

## 2024-05-02 NOTE — H&P (Addendum)
 " History and Physical   [name] FMW:[id number] DOB: 07/22/1950 DOA: 05/02/2024  PCP: Vincente Shivers, NP  Patient coming from: Home  I have personally briefly reviewed patient's old medical records in [facility] EMR.  Chief Concern: shortness of breath  HPI: [name] is a 73 year old female with history of CKD 3B, chronic insomnia, hypertension, hyperlipidemia, depression with anxiety, chronic muscle aches.  05/02/2024: She presents to the ED for chief concerns of shortness of breath for several days.  Vitals in the ED showed T of 102.6, RR 16, hr 73, blood pressure 122/61, SpO2 93% on room air.  Serum sodium is 135, potassium 4.0, chloride 100, bicarb 22, BUN of 18, serum creatinine of 1.26, eGFR 44, nonfasting blood glucose 137, WBC 8.9, hemoglobin 12.3, platelets of 157.  HS troponin < 15.  Lactic acid 1.0.  Blood cultures x 2 are in process.  COVID/influenza A/influenza B/RSV PCR were negative.  ED treatment: Acetaminophen  1000 mg p.o. one-time dose, ondansetron  4 mg IV one-time dose, sodium chloride  500 mL liter bolus.  12/24, patient admitted to hospitalist service for chief concerns of fever suspect secondary to commune acquired pneumonia. --------------------------------------------------- At bedside, patient was able to tell me her first name, her age, her date of birth, current calendar year, current location.  Patient reports she has been short of breath and has had a cough that is productive for the past 3 days.  Patient reports that she lives close to her grandchildren.  Her grandchildren live in the back home next to her home.  Her grandchildren have been sick.  She reports she had a fever yesterday at 101.  She reports today she had nausea and vomiting twice.  She woke up and had a cup of coffee and then she vomited brown with sputum.  She reports the vomitus was not coffee-ground in consistency.  The nausea and vomiting prompted her to come to the emergency  department for further evaluation.  Social history: She is widowed, her Husband passed away May 05, 2023.  Patient denies tobacco, EtOH, recreational drug use.  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  ROS: Constitutional: no weight change, + fever ENT/Mouth: no sore throat, no rhinorrhea Eyes: no eye pain, no vision changes Cardiovascular: no chest pain, + dyspnea,  no edema, no palpitations Respiratory: + cough, + sputum, no wheezing Gastrointestinal: + nausea, + vomiting, no diarrhea, no constipation Genitourinary: no urinary incontinence, no dysuria, no hematuria Musculoskeletal: no arthralgias, no myalgias Skin: no skin lesions, no pruritus, Neuro: + weakness, no loss of consciousness, no syncope Psych: no anxiety, no depression, + decrease appetite Heme/Lymph: no bruising, no bleeding  ED Course: Discussed with EDP, patient requiring hospitalization for chief concerns of fever secondary to community-acquired pneumonia.  Assessment/Plan  Principal Problem:   Community acquired pneumonia Active Problems:   Hypertension   Fever   Chronic insomnia   Anxiety and depression   Gastroesophageal reflux disease   Stage 3b chronic kidney disease (HCC)   Statin intolerance   Mixed hyperlipidemia   Assessment and Plan:  * Community acquired pneumonia Azithromycin  500 mg IV daily, ceftriaxone  2 g IV daily to complete a 5-day course ordered on admission DuoNebs every 6 hours as needed for wheezing and shortness of breath, 3 days ordered IS, Flutter valve  Fever With elevated respiration rate, source of infection is community-acquired pneumonia At this time sepsis cannot be ruled out Blood cultures x 2 are in process Check a  UA, pending collection at this time  Hypertension Home amlodipine  5 mg daily resumed Hydralazine  5 mg IV every 6 hours as needed for SBP greater 170, 5 days ordered  Mixed hyperlipidemia Ezetimibe  10 mg daily resumed  Stage  3b chronic kidney disease (HCC) At the time  Gastroesophageal reflux disease Protonix  40 mg p.o. twice daily before meals resumed  Anxiety and depression PDMP reviewed, currently no active prescription Home sertraline  200 mg daily, trazodone  150 mg nightly resumed  Chronic insomnia Home trazodone  150 mg nightly resumed  Chart reviewed.   DVT prophylaxis: Enoxaparin  Code Status: Full code Diet: heart healthy Family Communication: no  Disposition Plan: Pending clinical course Consults called: not at this time  Admission status: Telemetry, inpatient  Past Medical History:  Diagnosis Date   Anxiety    Arthritis    oa   Complication of anesthesia    Depression    GERD (gastroesophageal reflux disease)    Hypertension    Papilloma of right breast 08/04/2016   Past Surgical History:  Procedure Laterality Date   ABDOMINAL HYSTERECTOMY     APPENDECTOMY     BREAST BIOPSY Right 05/11/2016   right stereo for calcs   BREAST LUMPECTOMY WITH RADIOACTIVE SEED LOCALIZATION Right 08/04/2016   Procedure: RIGHT BREAST LUMPECTOMY WITH RADIOACTIVE SEED LOCALIZATION;  Surgeon: Elon Pacini, MD;  Location: [facility] OR;  Service: General;  Laterality: Right;   CHOLECYSTECTOMY     Social History:  reports that she has never smoked. She has never used smokeless tobacco. She reports that she does not drink alcohol and does not use drugs.  Allergies[1] Family History  Problem Relation Age of Onset   Breast cancer Mother 21   Breast cancer Paternal Aunt    Family history: Family history reviewed and not pertinent.  Prior to Admission medications  Medication Sig Start Date End Date Taking? Authorizing Provider  amLODipine  (NORVASC ) 5 MG tablet Take 1 tablet (5 mg total) by mouth daily. 01/03/24 02/06/25  Vincente Shivers, NP  APPLE CIDER VINEGAR PO Take by mouth.    [provider]  aspirin 325 MG tablet Take 325 mg by mouth at bedtime.    [provider]  dicyclomine  (BENTYL ) 10  MG capsule Take 1 capsule (10 mg total) by mouth 2 (two) times daily as needed for spasms. 01/03/24   Vincente Shivers, NP  ezetimibe  (ZETIA ) 10 MG tablet TAKE 1 TABLET BY MOUTH EVERY DAY 03/19/24   Vincente Shivers, NP  ibuprofen  (ADVIL ,MOTRIN ) 200 MG tablet Take 400-800 mg by mouth every 8 (eight) hours as needed (for pain/headaches.).    [provider]  MAGNESIUM COMPLEX HIGH POTENCY PO Take by mouth.    [provider]  metoprolol  tartrate (LOPRESSOR ) 25 MG tablet Take 1 tablet (25 mg total) by mouth 2 (two) times daily. 01/03/24   Vincente Shivers, NP  Multiple Vitamin (MULTIVITAMIN WITH MINERALS) TABS tablet Take 1 tablet by mouth daily. Centrum Silver    [provider]  olopatadine  (PATANOL) 0.1 % ophthalmic solution Place 1 drop into both eyes 2 (two) times daily. 06/12/17   Mario Million, MD  Omega-3 Fatty Acids (FISH OIL) 1200 MG CAPS Take 1,200 mg by mouth 2 (two) times daily.    [provider]  pantoprazole  (PROTONIX ) 40 MG tablet TAKE 1 TABLET BY MOUTH TWICE A DAY 03/19/24   Vincente Shivers, NP  sertraline  (ZOLOFT ) 100 MG tablet Take 2 tablets (200 mg total) by mouth daily. 01/03/24   Vincente Shivers, NP  tiZANidine  (  ZANAFLEX ) 4 MG tablet TAKE 1 TABLET BY MOUTH AT BEDTIME. 03/19/24   Vincente Shivers, NP  traZODone  (DESYREL ) 150 MG tablet Take 1 tablet (150 mg total) by mouth at bedtime. 03/19/24   Vincente Shivers, NP  Turmeric 500 MG CAPS Take 500 mg by mouth 2 (two) times daily.    [provider]   Physical Exam: Vitals:   05/02/24 1125 05/02/24 1200 05/02/24 1230 05/02/24 1330  BP: 122/61 (!) 102/54 (!) 122/59 (!) 111/58  Pulse: 79 76 73 79  Resp: 17 (!) 24 (!) 21 17  Temp:  100 F (37.8 C)    TempSrc:  Oral    SpO2: 94% 94% 91% 92%  Weight:       Constitutional: appears age appropriate, frail, calm Eyes: PERRL, lids and conjunctivae normal ENMT: Mucous membranes are moist. Posterior pharynx clear of any exudate or lesions. Age-appropriate  dentition. Hearing appropriate Neck: normal, supple, no masses, no thyromegaly Respiratory: clear to auscultation bilaterally, no wheezing, no crackles. Normal respiratory effort. No accessory muscle use.  Cardiovascular: Regular rate and rhythm, no murmurs / rubs / gallops. No extremity edema. 2+ pedal pulses. No carotid bruits.  Abdomen: no tenderness, no masses palpated, no hepatosplenomegaly. Bowel sounds positive.  Musculoskeletal: no clubbing / cyanosis. No joint deformity upper and lower extremities. Good ROM, no contractures, no atrophy. Normal muscle tone.  Skin: no rashes, lesions, ulcers. No induration Neurologic: Sensation intact. Strength 5/5 in all 4.  Psychiatric: Normal judgment and insight. Alert and oriented x 3. Normal mood.   EKG: independently reviewed, showing sinus rhythm with rate of 78, QTc 433  Chest x-ray on Admission: I personally reviewed and I agree with radiologist reading as below.  DG Chest 2 View Result Date: 05/02/2024 CLINICAL DATA:  Fever. EXAM: CHEST - 2 VIEW COMPARISON:  None Available. FINDINGS: The heart size and mediastinal contours are within normal limits. Mild atelectasis and/or infiltrate is seen within the anterior aspect of the mid to lower left lung. No pleural effusion or pneumothorax is identified. Multilevel degenerative changes are present throughout the thoracic spine. IMPRESSION: Mild anterior left lung atelectasis and/or infiltrate. Electronically Signed   By: Suzen Dials M.D.   On: 05/02/2024 11:29   Labs on Admission: I have personally reviewed following labs  CBC: Recent Labs  Lab 05/02/24 1015  WBC 8.9  HGB 12.3  HCT 37.4  MCV 91.7  PLT 157   Basic Metabolic Panel: Recent Labs  Lab 05/02/24 1015  NA 135  K 4.0  CL 100  CO2 22  GLUCOSE 137*  BUN 18  CREATININE 1.26*  CALCIUM 8.9   GFR: Estimated Creatinine Clearance: 34.9 mL/min (A) (by C-G formula based on SCr of 1.26 mg/dL (H)).  Liver Function  Tests: Recent Labs  Lab 05/02/24 1015  AST 30  ALT 16  ALKPHOS 95  BILITOT 0.4  PROT 7.3  ALBUMIN 4.2   Urine analysis:    Component Value Date/Time   COLORURINE YELLOW 01/08/2011 0534   APPEARANCEUR CLEAR 01/08/2011 0534   LABSPEC 1.015 10/09/2017 1213   PHURINE 5.5 10/09/2017 1213   GLUCOSEU NEGATIVE 10/09/2017 1213   HGBUR MODERATE (A) 10/09/2017 1213   BILIRUBINUR NEGATIVE 10/09/2017 1213   KETONESUR NEGATIVE 10/09/2017 1213   PROTEINUR NEGATIVE 10/09/2017 1213   UROBILINOGEN 0.2 10/09/2017 1213   NITRITE NEGATIVE 10/09/2017 1213   LEUKOCYTESUR LARGE (A) 10/09/2017 1213   This document was prepared using Dragon Voice Recognition software and may include unintentional dictation errors.  Dr.  Elleanor Guyett [facility]  If 7PM-7AM, please contact overnight-coverage provider If 7AM-7PM, please contact day attending provider [email or web address]  05/02/2024, 2:07 PM      [1]  Allergies Allergen Reactions   Erythromycin Other (See Comments)    Severe abdominal pain.   Valium [Diazepam] Other (See Comments)    Mood disturbances/combative behaviors.   Venlafaxine Rash   "

## 2024-05-02 NOTE — Telephone Encounter (Signed)
 Noted

## 2024-05-02 NOTE — Assessment & Plan Note (Addendum)
 Azithromycin  500 mg IV daily, ceftriaxone  2 g IV daily to complete a 5-day course ordered on admission DuoNebs every 6 hours as needed for wheezing and shortness of breath, 3 days ordered IS, Flutter valve

## 2024-05-02 NOTE — Assessment & Plan Note (Signed)
 At the time

## 2024-05-02 NOTE — Telephone Encounter (Signed)
 FYI Only or Action Required?: FYI only for provider: ED advised.  Patient was last seen in primary care on 01/03/2024 by Emma Shivers, NP.  Called Nurse Triage reporting Shortness of Breath.  Symptoms began several days ago.  Interventions attempted: Other: unable to assess due to urgency .  Symptoms are: gradually worsening.  Triage Disposition: Go to ED Now (Notify PCP)  Patient/caregiver understands and will follow disposition?: Yes               Reason for Disposition  [1] MODERATE difficulty breathing ([name].g., speaks in phrases, SOB even at rest, pulse 100-120) AND [2] NEW-onset or WORSE than normal  Answer Assessment - Initial Assessment Questions Patient reports new onset trouble breathing that has been constant since last night. Endorsing difficulty breathing audible speaking in phrases. Patient home with son. This writer advised to go to nearest emergency room both patient and son agreeable. Son bringing patient to ER now if smyptoms worsening in meantime will call 911.     1. RESPIRATORY STATUS: Describe your breathing? ([name].g., wheezing, shortness of breath, unable to speak, severe coughing)      shortness of breath started last night , at rest constant  2. ONSET: When did this breathing problem begin?      Cough started Saturday , had thought improved , worse yesterday last night shortness of breath started and has been constant  3. PATTERN Does the difficult breathing come and go, or has it been constant since it started?      Constant  4. SEVERITY: How bad is your breathing? ([name].g., mild, moderate, severe)      Moderate audibly speaking in phrases , reports this breathing difficulty is new states Harder to breath started last night , all the time  5. RECURRENT SYMPTOM: Have you had difficulty breathing before? If Yes, ask: When was the last time? and What happened that time?      Denies  6. CARDIAC HISTORY: Do you have any history of heart  disease? ([name].g., heart attack, angina, bypass surgery, angioplasty)      Hypertension per chart  8. CAUSE: What do you think is causing the breathing problem?      Mentions cough productive ,chills, dizziness started Saturday, currently  not passed out but feels dizzy  9. OTHER SYMPTOMS: Do you have any other symptoms? ([name].g., chest pain, cough, dizziness, fever, runny nose)     Chest pain when coughing only, chills, cough productive  Denies the following blue color of lips or face, chest pain when not coughing, syncope,  10. O2 SATURATION MONITOR:  Do you use an oxygen saturation monitor (pulse oximeter) at home? If Yes, ask: What is your reading (oxygen level) today? What is your usual oxygen saturation reading? ([name].g., 95%) Unable to assess due on call  Protocols used: Breathing Difficulty-A-AH Copied from CRM #[id number]. Topic: Clinical - Red Word Triage >> May 02, 2024  9:08 AM [name] wrote: [facility] that prompted transfer to Nurse Triage: Potential flu. Patient stated she is having a productive cough producing yellow phlegm, chills, back pain, and congestion. Symptoms have been going on for 1 day.

## 2024-05-02 NOTE — ED Notes (Signed)
Pt gone to xray

## 2024-05-02 NOTE — Assessment & Plan Note (Signed)
 With elevated respiration rate, source of infection is community-acquired pneumonia At this time sepsis cannot be ruled out Blood cultures x 2 are in process Check a UA, pending collection at this time

## 2024-05-02 NOTE — Telephone Encounter (Signed)
 Agree with disposition. Patient needs to be evaluated at the ER as she is not able to complete her sentences without getting short of breath.

## 2024-05-02 NOTE — Assessment & Plan Note (Signed)
 Home amlodipine  5 mg daily resumed Hydralazine  5 mg IV every 6 hours as needed for SBP greater 170, 5 days ordered

## 2024-05-02 NOTE — ED Notes (Signed)
 Called CCMD for central monitoring at this time

## 2024-05-02 NOTE — Plan of Care (Signed)
" °  Problem: Respiratory: Goal: Ability to maintain adequate ventilation will improve Outcome: Progressing   Problem: Clinical Measurements: Goal: Respiratory complications will improve Outcome: Progressing Goal: Cardiovascular complication will be avoided Outcome: Progressing   Problem: Coping: Goal: Level of anxiety will decrease Outcome: Progressing   Problem: Pain Managment: Goal: General experience of comfort will improve and/or be controlled Outcome: Progressing   "

## 2024-05-02 NOTE — Plan of Care (Signed)
" °  Problem: Education: Goal: Knowledge of General Education information will improve Description: Including pain rating scale, medication(s)/side effects and non-pharmacologic comfort measures Outcome: Progressing   Problem: Clinical Measurements: Goal: Respiratory complications will improve Outcome: Progressing   Problem: Clinical Measurements: Goal: Cardiovascular complication will be avoided Outcome: Progressing   Problem: Activity: Goal: Risk for activity intolerance will decrease Outcome: Progressing   Problem: Elimination: Goal: Will not experience complications related to urinary retention Outcome: Progressing   Problem: Elimination: Goal: Will not experience complications related to bowel motility Outcome: Progressing   Problem: Pain Managment: Goal: General experience of comfort will improve and/or be controlled Outcome: Progressing   Problem: Safety: Goal: Ability to remain free from injury will improve Outcome: Progressing   "

## 2024-05-02 NOTE — Consult Note (Signed)
 CODE SEPSIS - PHARMACY COMMUNICATION  **Broad Spectrum Antibiotics should be administered within 1 hour of Sepsis diagnosis**  Time Code Sepsis Called/Page Received: 1235  Antibiotics Ordered: Rocephin  and Azithromycin  x1  Time of 1st antibiotic administration: 1242  Additional action taken by pharmacy: none  If necessary, Name of Provider/Nurse Contacted: n/a    [name] ,PharmD Clinical Pharmacist  05/02/2024  12:37 PM

## 2024-05-02 NOTE — Hospital Course (Signed)
 [name] is a 73 year old female with history of CKD 3B, chronic insomnia, hypertension, hyperlipidemia, depression with anxiety, chronic muscle aches.  05/02/2024: She presents to the ED for chief concerns of shortness of breath for several days.  Vitals in the ED showed T of 102.6, RR 16, hr 73, blood pressure 122/61, SpO2 93% on room air.  Serum sodium is 135, potassium 4.0, chloride 100, bicarb 22, BUN of 18, serum creatinine of 1.26, eGFR 44, nonfasting blood glucose 137, WBC 8.9, hemoglobin 12.3, platelets of 157.  HS troponin < 15.  Lactic acid 1.0.  Blood cultures x 2 are in process.  COVID/influenza A/influenza B/RSV PCR were negative.  ED treatment: Acetaminophen  1000 mg p.o. one-time dose, ondansetron  4 mg IV one-time dose, sodium chloride  500 mL liter bolus.  12/24, patient admitted to hospitalist service for chief concerns of fever suspect secondary to commune acquired pneumonia.

## 2024-05-02 NOTE — Assessment & Plan Note (Signed)
 Home trazodone  150 mg nightly resumed

## 2024-05-02 NOTE — ED Provider Notes (Signed)
 "  [facility] Provider Note    Event Date/Time   First MD Initiated Contact with Patient 05/02/24 1046     (approximate)   History   pre syncope   HPI  [name] is a 73 y.o. female who comes in with concerns for previous syncope and feeling like she was going to pass out.  Symptoms started this morning.  Patient noted to be febrile.  Reports she had falls little bit of illness a few days ago had improved and then started to get worse again this morning.  She reports that she stood up and had this feeling of extreme weakness and like she was going to pass out but did not lose consciousness.  She reports continued shortness of breath, coughing, intermittent feeling like she is wheezing.   Physical Exam   Triage Vital Signs: ED Triage Vitals  Encounter Vitals Group     BP 05/02/24 1004 (!) 149/83     Girls Systolic BP Percentile --      Girls Diastolic BP Percentile --      Boys Systolic BP Percentile --      Boys Diastolic BP Percentile --      Pulse Rate 05/02/24 1004 80     Resp 05/02/24 1004 17     Temp 05/02/24 1004 (!) 102.6 F (39.2 C)     Temp Source 05/02/24 1004 Oral     SpO2 05/02/24 1004 96 %     Weight 05/02/24 1009 149 lb 14.6 oz (68 kg)     Height --      Head Circumference --      Peak Flow --      Pain Score 05/02/24 1009 0     Pain Loc --      Pain Education --      Exclude from Growth Chart --     Most recent vital signs: Vitals:   05/02/24 1004  BP: (!) 149/83  Pulse: 80  Resp: 17  Temp: (!) 102.6 F (39.2 C)  SpO2: 96%     General: Awake, no distress.  CV:  Good peripheral perfusion.  Resp:  Coarse breath sounds bilaterally no obvious wheeze noted. Abd:  No distention.  Other:  No swelling in legs   ED Results / Procedures / Treatments   Labs (all labs ordered are listed, but only abnormal results are displayed) Labs Reviewed  RESP PANEL BY RT-PCR (RSV, FLU A&B, COVID)  [id number]  CBC  COMPREHENSIVE  METABOLIC PANEL WITH GFR  URINALYSIS, ROUTINE W REFLEX MICROSCOPIC  CBG MONITORING, ED     EKG  My interpretation of EKG:  Normal sinus rate of 78 without any ST elevation or T wave inversions, normal intervals  RADIOLOGY I have reviewed the xray personally and interpreted concerning for a left lower lobe pneumonia   PROCEDURES:  Critical Care performed: Yes, see critical care procedure note(s)  .1-3 Lead EKG Interpretation  Performed by: Ernest Ronal BRAVO, MD Authorized by: Ernest Ronal BRAVO, MD     Interpretation: normal     ECG rate:  70   ECG rate assessment: normal     Rhythm: sinus rhythm     Ectopy: none     Conduction: normal      MEDICATIONS ORDERED IN ED: Medications  acetaminophen  (TYLENOL ) tablet 1,000 mg (1,000 mg Oral Given 05/02/24 1014)     IMPRESSION / MDM / ASSESSMENT AND PLAN / ED COURSE  I reviewed the triage vital  signs and the nursing notes.   Patient's presentation is most consistent with acute presentation with potential threat to life or bodily function.   Patient comes in with worsening shortness of breath presyncopal symptoms in the setting of cough with coarse breath sounds.  This is concerning for either flu COVID or pneumonia.  PE seems less likely as the symptoms have been ongoing for only a few days and seem more consistent with infectious process and patient is noted to be febrile.  No evidence of DVT on examination.  CBC is reassuring CMP shows creatinine around baseline.  Troponin was negative.  Chest x-ray concerning for pneumonia.  Her flu and COVID were negative given patient's age and presyncopal symptoms hide will discuss with the hospitalist for admission for monitoring overnight.  Given patient's age will discuss to hospital team for admission for pneumonia  The patient is on the cardiac monitor to evaluate for evidence of arrhythmia and/or significant heart rate changes.      FINAL CLINICAL IMPRESSION(S) / ED DIAGNOSES    Final diagnoses:  Pneumonia due to infectious organism, unspecified laterality, unspecified part of lung     Rx / DC Orders   ED Discharge Orders     None        Note:  This document was prepared using Dragon voice recognition software and may include unintentional dictation errors.   Ernest Ronal BRAVO, MD 05/02/24 1406  "

## 2024-05-02 NOTE — Assessment & Plan Note (Signed)
 Protonix  40 mg p.o. twice daily before meals resumed

## 2024-05-02 NOTE — Assessment & Plan Note (Addendum)
 PDMP reviewed, currently no active prescription Home sertraline  200 mg daily, trazodone  150 mg nightly resumed

## 2024-05-03 DIAGNOSIS — F32A Depression, unspecified: Secondary | ICD-10-CM | POA: Diagnosis not present

## 2024-05-03 DIAGNOSIS — A419 Sepsis, unspecified organism: Secondary | ICD-10-CM | POA: Diagnosis not present

## 2024-05-03 DIAGNOSIS — J189 Pneumonia, unspecified organism: Secondary | ICD-10-CM | POA: Diagnosis not present

## 2024-05-03 DIAGNOSIS — N1832 Chronic kidney disease, stage 3b: Secondary | ICD-10-CM | POA: Diagnosis not present

## 2024-05-03 DIAGNOSIS — F419 Anxiety disorder, unspecified: Secondary | ICD-10-CM | POA: Diagnosis not present

## 2024-05-03 DIAGNOSIS — I10 Essential (primary) hypertension: Secondary | ICD-10-CM | POA: Diagnosis not present

## 2024-05-03 LAB — CBC
HCT: 33.6 % — ABNORMAL LOW (ref 36.0–46.0)
Hemoglobin: 10.9 g/dL — ABNORMAL LOW (ref 12.0–15.0)
MCH: 30.3 pg (ref 26.0–34.0)
MCHC: 32.4 g/dL (ref 30.0–36.0)
MCV: 93.3 fL (ref 80.0–100.0)
Platelets: 137 K/uL — ABNORMAL LOW (ref 150–400)
RBC: 3.6 MIL/uL — ABNORMAL LOW (ref 3.87–5.11)
RDW: 12.7 % (ref 11.5–15.5)
WBC: 14.7 K/uL — ABNORMAL HIGH (ref 4.0–10.5)
nRBC: 0 % (ref 0.0–0.2)

## 2024-05-03 LAB — BASIC METABOLIC PANEL WITH GFR
Anion gap: 12 (ref 5–15)
BUN: 20 mg/dL (ref 8–23)
CO2: 25 mmol/L (ref 22–32)
Calcium: 8.2 mg/dL — ABNORMAL LOW (ref 8.9–10.3)
Chloride: 103 mmol/L (ref 98–111)
Creatinine, Ser: 1.51 mg/dL — ABNORMAL HIGH (ref 0.44–1.00)
GFR, Estimated: 36 mL/min — ABNORMAL LOW
Glucose, Bld: 118 mg/dL — ABNORMAL HIGH (ref 70–99)
Potassium: 4 mmol/L (ref 3.5–5.1)
Sodium: 140 mmol/L (ref 135–145)

## 2024-05-03 MED ORDER — BENZONATATE 100 MG PO CAPS
100.0000 mg | ORAL_CAPSULE | Freq: Three times a day (TID) | ORAL | Status: DC | PRN
  Administered 2024-05-04 – 2024-05-07 (×5): 100 mg via ORAL
  Filled 2024-05-03 (×5): qty 1

## 2024-05-03 MED ORDER — DM-GUAIFENESIN ER 30-600 MG PO TB12
1.0000 | ORAL_TABLET | Freq: Two times a day (BID) | ORAL | Status: DC
  Administered 2024-05-03 – 2024-05-07 (×9): 1 via ORAL
  Filled 2024-05-03 (×8): qty 1

## 2024-05-03 NOTE — Discharge Instructions (Signed)
 Resources for Utilities:  [employer] is an information and referral service provided by [employer] of [place]  and powered by local [employer] [place] . Families and individuals can call 2-1-1 or [phone] to receive free and confidential information on health and human services within their community.  Low Income Energy Assistance (LIEAP) As of Dec. 1, 2025, [place]  has received its notice of funding for the Low Income Home Energy Assistance Program from the federal government. [facility] is working to open up new applications for the [employer] Program (LIEAP). While this normally happens on Dec. 1 each year, the application period is slightly delayed due to the delay in receiving federal funding. [employer] will release more information as soon as the application process begins.    Please note: People who qualify for the auto-renewal are not impacted and their energy provider received the benefit on Dec. 1, 2025, as expected. Households meeting the requirements for the automatic payment were notified through November 2025 and do not need to re-apply for LIEAP.   Overview The Low Income Energy Assistance Program (LIEAP) is a federally-funded program that provides for a one-time vendor payment to help eligible households pay their heating bills.  Households including a person aged 86 or older or disabled persons receiving services through the [place] Division of Aging and Adult Services are eligible to sign up for assistance from Dec. 1 - 31. All other households may apply from Jan. 1 - March 31 or until funds are exhausted.  Households that meet the following criteria may be eligible:  Have at least one U.[place] citizen or non-citizen who meets the eligibility criteria. Meet an income test. Be responsible for its heating costs   Eligibility The following chart describes the Low Income Energy Assistance Eligibility Requirements.  Eligibility Requirement What's  Needed How Often Identity ID, Contact with someone knowledgeable of your situation. At application Address Lease, rental agreement, any other shelter expenses, contact with someone knowledgeable of your situation. At application or situation, if moving Citizenship/ Alien Status Verification of citizenship/alien status via Immigration and [employer], [employer]. At application Social Security Number Valid social security card or number. At application Earned Income: Wages/Self-Employment Wage stubs, tax forms contacting employer. At application Unearned Income: Engineer, Production Retirement Benefits/Trust, catering manager. Documents from provider, award letter. At application Assets/Resources Ownership and tax records - bank and court documents. At application Heating Expense Copy of bills. At application At&t Income Levels Based on 130% of Federal Poverty  How to Apply Households including a person aged 5 or older or an individual receiving disability benefits and services through the [place] Division of Aging and Adult Services are eligible to sign up for assistance from Dec. 1 - 31. All other households may apply from Jan. 1 - March 31, or until funds are exhausted. Contact your local Department of Social Services for the application dates and for additional information on LIEAP. To apply for Energy Assistance online please visit [facility] - ePASS.  Contact For more information, please contact your local Department of Social Services or the [place] Division of Social Services.  [employer] Energy Assistance Energy Provider Portal Low Income Energy Assistance Crisis Intervention Program Public Notice: Administrator, Civil Service Information [place] [facility] and [facility] [address] [place], [place] [address]  Customer Service Center: [phone] Visit [employer] for information about TTY services.  Follow Us 

## 2024-05-03 NOTE — TOC CM/SW Note (Signed)
 Transition of Care [facility]) - Inpatient Brief Assessment   Patient Details  Name: [name] MRN: [id number] Date of Birth: 1950-10-08  [facility]) CM/SW Contact:    Nathanael CHRISTELLA Ring, RN Phone Number: 05/03/2024, 3:10 PM   Clinical Narrative: Resources for utility assistance added to AVS.  No other TOC needs identified at this time.  Consult if needs arise.    Transition of Care Asessment: Insurance and Status: Insurance coverage has been reviewed Patient has primary care physician: Yes Home environment has been reviewed: Home Prior level of function:: independent Prior/Current Home Services: No current home services Social Drivers of Health Review: SDOH reviewed interventions complete Readmission risk has been reviewed: Yes Transition of care needs: no transition of care needs at this time

## 2024-05-03 NOTE — Progress Notes (Signed)
Per Dr Patel d.c tele monitoring 

## 2024-05-03 NOTE — Progress Notes (Signed)
 [facility]  - [place] at [facility]   PATIENT NAME: [name]    MR#:  [id number]  DATE OF BIRTH:  01-23-1951  SUBJECTIVE:  son at bedside. Patient came in with fever cough congestion. Grandkids sick at home.    VITALS:  Blood pressure 129/62, pulse 92, temperature 98.1 F (36.7 C), resp. rate 15, height 5' 3 (1.6 m), weight 68 kg, SpO2 92%.  PHYSICAL EXAMINATION:   GENERAL:  73 y.o.-year-old patient with no acute distress.  LUNGS: Normal breath sounds bilaterally, no wheezing CARDIOVASCULAR: S1, S2 normal. No murmur mild tachycardia ABDOMEN: Soft, nontender, nondistended. Bowel sounds present.  EXTREMITIES: No  edema b/l.    NEUROLOGIC: nonfocal  patient is alert and awake   LABORATORY PANEL:  CBC Recent Labs  Lab 05/03/24 0504  WBC 14.7*  HGB 10.9*  HCT 33.6*  PLT 137*    Chemistries  Recent Labs  Lab 05/02/24 1015 05/03/24 0504  NA 135 140  K 4.0 4.0  CL 100 103  CO2 22 25  GLUCOSE 137* 118*  BUN 18 20  CREATININE 1.26* 1.51*  CALCIUM 8.9 8.2*  AST 30  --   ALT 16  --   ALKPHOS 95  --   BILITOT 0.4  --    Cardiac Enzymes No results for input(s): TROPONINI in the last 168 hours. RADIOLOGY:  DG Chest 2 View Result Date: 05/02/2024 CLINICAL DATA:  Fever. EXAM: CHEST - 2 VIEW COMPARISON:  None Available. FINDINGS: The heart size and mediastinal contours are within normal limits. Mild atelectasis and/or infiltrate is seen within the anterior aspect of the mid to lower left lung. No pleural effusion or pneumothorax is identified. Multilevel degenerative changes are present throughout the thoracic spine. IMPRESSION: Mild anterior left lung atelectasis and/or infiltrate. Electronically Signed   By: Suzen Dials M.D.   On: 05/02/2024 11:29    Assessment and Plan  [name] is a 73 year old female with history of CKD 3B, chronic insomnia, hypertension, hyperlipidemia, depression with anxiety, chronic muscle aches.  She presents to  the ED for chief concerns of shortness of breath for several days.  Vitals in the ED showed T of 102.6, RR 16, hr 73, blood pressure 122/61, SpO2 93% on room air.   COVID/influenza A/influenza B/RSV PCR were negative.  Chest x-ray Mild anterior left lung atelectasis and/or infiltrate.   Sepsis due to community acquired pneumonia -- continue azithromycin , ceftriaxone   --DuoNebs every 6 hours as needed for wheezing and shortness of breath,IS, Flutter valve  -- patient had fever of 102, tachycardic, white count of 14,000. -- Blood cultures negative so far   Hypertension --Home amlodipine  --prn Hydralazine     Mixed hyperlipidemia --Ezetimibe     Stage 3b chronic kidney disease (HCC) --received IVF -- stable at present patient follows with nephrology as   Gastroesophageal reflux disease continue PPI   anxiety and depression -- continue home meds  Chronic insomnia Home trazodone  resumed   Chart reviewed.    Procedures: Family communication : son at bedside Consults : none CODE STATUS: full DVT Prophylaxis :lovenox  Level of care: Telemetry Status is: Inpatient Remains inpatient appropriate because: getting IV antibiotics for community acquired    TOTAL TIME TAKING CARE OF THIS PATIENT: 35 minutes.  >50% time spent on counselling and coordination of care  Note: This dictation was prepared with Dragon dictation along with smaller phrase technology. Any transcriptional errors that result from this process are unintentional.  Leita Blanch M.D    [facility]  CC: Primary care physician; Vincente Shivers, NP

## 2024-05-03 NOTE — Plan of Care (Signed)

## 2024-05-04 DIAGNOSIS — I10 Essential (primary) hypertension: Secondary | ICD-10-CM

## 2024-05-04 DIAGNOSIS — F419 Anxiety disorder, unspecified: Secondary | ICD-10-CM | POA: Diagnosis not present

## 2024-05-04 DIAGNOSIS — J189 Pneumonia, unspecified organism: Secondary | ICD-10-CM | POA: Diagnosis not present

## 2024-05-04 DIAGNOSIS — A419 Sepsis, unspecified organism: Secondary | ICD-10-CM | POA: Diagnosis not present

## 2024-05-04 DIAGNOSIS — N1832 Chronic kidney disease, stage 3b: Secondary | ICD-10-CM | POA: Diagnosis not present

## 2024-05-04 MED ORDER — IPRATROPIUM BROMIDE 0.02 % IN SOLN
0.5000 mg | RESPIRATORY_TRACT | Status: DC | PRN
  Administered 2024-05-05 – 2024-05-07 (×3): 0.5 mg via RESPIRATORY_TRACT
  Filled 2024-05-04 (×4): qty 2.5

## 2024-05-04 MED ORDER — AZITHROMYCIN 500 MG PO TABS
500.0000 mg | ORAL_TABLET | Freq: Every day | ORAL | Status: AC
  Administered 2024-05-04 – 2024-05-06 (×3): 500 mg via ORAL
  Filled 2024-05-04 (×3): qty 1

## 2024-05-04 MED ORDER — METOPROLOL TARTRATE 25 MG PO TABS
25.0000 mg | ORAL_TABLET | Freq: Two times a day (BID) | ORAL | Status: DC
  Administered 2024-05-04 – 2024-05-07 (×7): 25 mg via ORAL
  Filled 2024-05-04 (×6): qty 1

## 2024-05-04 NOTE — Plan of Care (Signed)
  Problem: Fluid Volume: Goal: Hemodynamic stability will improve Outcome: Progressing   Problem: Clinical Measurements: Goal: Diagnostic test results will improve Outcome: Progressing Goal: Signs and symptoms of infection will decrease Outcome: Progressing   Problem: Respiratory: Goal: Ability to maintain adequate ventilation will improve Outcome: Progressing   Problem: Education: Goal: Knowledge of General Education information will improve Description: Including pain rating scale, medication(s)/side effects and non-pharmacologic comfort measures Outcome: Progressing   Problem: Health Behavior/Discharge Planning: Goal: Ability to manage health-related needs will improve Outcome: Progressing   Problem: Clinical Measurements: Goal: Ability to maintain clinical measurements within normal limits will improve Outcome: Progressing Goal: Will remain free from infection Outcome: Progressing Goal: Diagnostic test results will improve Outcome: Progressing Goal: Respiratory complications will improve Outcome: Progressing Goal: Cardiovascular complication will be avoided Outcome: Progressing   Problem: Activity: Goal: Risk for activity intolerance will decrease Outcome: Progressing   Problem: Nutrition: Goal: Adequate nutrition will be maintained Outcome: Progressing   Problem: Coping: Goal: Level of anxiety will decrease Outcome: Progressing   Problem: Elimination: Goal: Will not experience complications related to bowel motility Outcome: Progressing Goal: Will not experience complications related to urinary retention Outcome: Progressing   Problem: Pain Managment: Goal: General experience of comfort will improve and/or be controlled Outcome: Progressing

## 2024-05-04 NOTE — Care Management Important Message (Signed)
 Important Message  Patient Details  Name: [name] MRN: [id number] Date of Birth: [birth year]   Important Message Given:  Yes - Medicare IM     Khalis Hittle W, CMA 05/04/2024, 2:49 PM

## 2024-05-04 NOTE — Progress Notes (Signed)
 PHARMACIST - PHYSICIAN COMMUNICATION CONCERNING: Antibiotic IV to Oral Route Change Policy  RECOMMENDATION: This patient is receiving azithromycin  intravenously. Based on criteria approved by the Pharmacy and Therapeutics Committee, the antibiotic(s) is/are being converted to the equivalent dose of an oral formulation.   DESCRIPTION: These criteria include: Patient being treated for a respiratory tract infection, urinary tract infection, cellulitis or Clostridioides difficile-associated diarrhea if on metronidazole. The patient is not neutropenic and does not exhibit a malabsorptive GI state. The patient is eating (either orally or via tube) and/or has been taking other orally administered medications for at least 24 hours. The patient is improving clinically and has a 24-hour Tmax of <100.5 F.  If you have questions about this conversion, please contact the Pharmacy Department:  [x]   [phone] )  [facility] []   [phone] )  [facility] []   [phone] )  [facility]  []   [phone] )  [facility]   Will M. Lenon, PharmD, BCPS Clinical Pharmacist 05/04/2024 2:06 PM

## 2024-05-04 NOTE — Plan of Care (Signed)
 SABRA

## 2024-05-04 NOTE — Progress Notes (Signed)
 [facility]  -  at [facility]   PATIENT NAME: [name]    MR#:  [id number]  DATE OF BIRTH:  1950-10-09  SUBJECTIVE:  No family at bedside. Patient came in with fever cough congestion. Grandkids sick at home No fever but cont with cough, poor appetite today    VITALS:  Blood pressure (!) 153/83, pulse 97, temperature 99.1 F (37.3 C), temperature source Oral, resp. rate 16, height 5' 3 (1.6 m), weight 68 kg, SpO2 95%.  PHYSICAL EXAMINATION:   GENERAL:  73 y.o.-year-old patient with no acute distress.  LUNGScoarse breath sounds bilaterally, no wheezing CARDIOVASCULAR: S1, S2 normal. No murmur mild tachycardia ABDOMEN: Soft, nontender, nondistended. Bowel sounds present.  EXTREMITIES: No  edema b/l.    NEUROLOGIC: nonfocal  patient is alert and awake   LABORATORY PANEL:  CBC Recent Labs  Lab 05/03/24 0504  WBC 14.7*  HGB 10.9*  HCT 33.6*  PLT 137*    Chemistries  Recent Labs  Lab 05/02/24 1015 05/03/24 0504  NA 135 140  K 4.0 4.0  CL 100 103  CO2 22 25  GLUCOSE 137* 118*  BUN 18 20  CREATININE 1.26* 1.51*  CALCIUM 8.9 8.2*  AST 30  --   ALT 16  --   ALKPHOS 95  --   BILITOT 0.4  --    Cardiac Enzymes No results for input(s): TROPONINI in the last 168 hours. RADIOLOGY:  No results found.   Assessment and Plan  [name] is a 73 year old female with history of CKD 3B, chronic insomnia, hypertension, hyperlipidemia, depression with anxiety, chronic muscle aches.  She presents to the ED for chief concerns of shortness of breath for several days.  Vitals in the ED showed T of 102.6, RR 16, hr 73, blood pressure 122/61, SpO2 93% on room air.   COVID/influenza A/influenza B/RSV PCR were negative.  Chest x-ray Mild anterior left lung atelectasis and/or infiltrate.   Sepsis due to community acquired pneumonia -- continue azithromycin , ceftriaxone   --DuoNebs every 6 hours as needed for wheezing and shortness of breath,IS, Flutter  valve  -- patient had fever of 102, tachycardic, white count of 14,000. -- Blood cultures negative so far --12/26--afebrile check CBC in am   Hypertension --Home amlodipine  --prn Hydralazine     Mixed hyperlipidemia --Ezetimibe     Stage 3b chronic kidney disease (HCC) --received IVF -- stable at present patient follows with nephrology as out pt   Gastroesophageal reflux disease continue PPI   anxiety and depression -- continue home meds  Chronic insomnia Home trazodone  resumed   Chart reviewed.    Procedures: Family communication : none today Consults : none CODE STATUS: full DVT Prophylaxis :lovenox  Level of care: Telemetry Status is: Inpatient Remains inpatient appropriate because: getting IV antibiotics for community acquired    TOTAL TIME TAKING CARE OF THIS PATIENT: 35 minutes.  >50% time spent on counselling and coordination of care  Note: This dictation was prepared with Dragon dictation along with smaller phrase technology. Any transcriptional errors that result from this process are unintentional.  Leita Blanch M.D    [facility]   CC: Primary care physician; Vincente Shivers, NP

## 2024-05-05 ENCOUNTER — Inpatient Hospital Stay

## 2024-05-05 LAB — BASIC METABOLIC PANEL WITH GFR
Anion gap: 6 (ref 5–15)
BUN: 14 mg/dL (ref 8–23)
CO2: 29 mmol/L (ref 22–32)
Calcium: 8.8 mg/dL — ABNORMAL LOW (ref 8.9–10.3)
Chloride: 106 mmol/L (ref 98–111)
Creatinine, Ser: 1.09 mg/dL — ABNORMAL HIGH (ref 0.44–1.00)
GFR, Estimated: 53 mL/min — ABNORMAL LOW
Glucose, Bld: 96 mg/dL (ref 70–99)
Potassium: 3.9 mmol/L (ref 3.5–5.1)
Sodium: 142 mmol/L (ref 135–145)

## 2024-05-05 LAB — CBC
HCT: 32 % — ABNORMAL LOW (ref 36.0–46.0)
Hemoglobin: 10.4 g/dL — ABNORMAL LOW (ref 12.0–15.0)
MCH: 29.7 pg (ref 26.0–34.0)
MCHC: 32.5 g/dL (ref 30.0–36.0)
MCV: 91.4 fL (ref 80.0–100.0)
Platelets: 168 K/uL (ref 150–400)
RBC: 3.5 MIL/uL — ABNORMAL LOW (ref 3.87–5.11)
RDW: 13 % (ref 11.5–15.5)
WBC: 10.5 K/uL (ref 4.0–10.5)
nRBC: 0 % (ref 0.0–0.2)

## 2024-05-05 NOTE — Progress Notes (Signed)
 " PROGRESS NOTE    [name]  FMW:[id number] DOB: 1951-04-25 DOA: 05/02/2024 PCP: Vincente Shivers, NP    Brief Narrative:  [name] is a 73 year old female with history of CKD 3B, chronic insomnia, hypertension, hyperlipidemia, depression with anxiety, chronic muscle aches.  05/02/2024: She presents to the ED for chief concerns of shortness of breath for several days.  Vitals in the ED showed T of 102.6, RR 16, hr 73, blood pressure 122/61, SpO2 93% on room air.  Serum sodium is 135, potassium 4.0, chloride 100, bicarb 22, BUN of 18, serum creatinine of 1.26, eGFR 44, nonfasting blood glucose 137, WBC 8.9, hemoglobin 12.3, platelets of 157.  HS troponin < 15.  Lactic acid 1.0.  Blood cultures x 2 are in process.  COVID/influenza A/influenza B/RSV PCR were negative.  ED treatment: Acetaminophen  1000 mg p.o. one-time dose, ondansetron  4 mg IV one-time dose, sodium chloride  500 mL liter bolus.  12/24, patient admitted to hospitalist service for chief concerns of fever suspect secondary to commune acquired pneumonia.    Assessment and Plan:  #Sepsis - Met SIRS criteria with fever to 102.6 F, later febrile to 100.5 F, tachypnea, and leukocytosis 14.7 - Lactic acid within normal limits - Likely secondary to community-acquired pneumonia - Blood cultures no growth to date - Management of community-acquired pneumonia as below  #Acute hypoxic respiratory failure - Patient noted to be hypoxic to 81% on RA on 12/24, while receiving LR at 150 mL an hour.  SpO2 improved to 92% on 3.5 L O2.  IVF's were discontinued and she received IV Lasix  20 mg x 1 - CXR 12/27 showed increasing opacity at the left lung base with likely airspace component pleural effusion - Currently requiring 3L [place] to maintain SpO2 > 90%  - Continue to wean O2 as tolerated  # CAP - Reportedly began to have symptoms of productive cough on Saturday, which she felt improved, but then worsened on Tuesday with development  of constant shortness of breath, chills, with continued productive cough - CXR showed mild anterior left lung atelectasis and/or infiltrate - 4 Plex PCR negative - Afebrile, leukocytosis resolved - Continue IV Rocephin  and p.o. azithromycin  - Scheduled Atrovent  nebs  #AKI on CKD stage IIIa - resolved - Follows in consistently with outpatient nephrology - Creatinine elevated to 1.51 on 05/03/2024, up from baseline of around 1.15 - Resolved with creatinine at 1.09 - Recommend continued follow-up with nephrology in the outpatient setting  #Nausea/vomiting - resolved - Presented with few episodes of nausea and vomiting which have resolved at this time  #Hypertension - Continue home amlodipine  5 mg daily and metoprolol  25 mg twice daily  #Mixed hyperlipidemia - Continue home ezetimibe   #GERD - Continue PPI  #Anxiety depression - Continue home Zoloft  200 mg daily  #Insomnia - Continue home trazodone  150 mg nightly  Scheduled Meds:  amLODipine   5 mg Oral Daily   azithromycin   500 mg Oral Daily   dextromethorphan -guaiFENesin   1 tablet Oral BID   enoxaparin  (LOVENOX ) injection  40 mg Subcutaneous Q24H   ezetimibe   10 mg Oral QHS   lidocaine   1 patch Transdermal Q24H   metoprolol  tartrate  25 mg Oral BID   pantoprazole   40 mg Oral BID AC   sertraline   200 mg Oral Daily   traZODone   150 mg Oral QHS   Continuous Infusions:  cefTRIAXone  (ROCEPHIN )  IV Stopped (05/04/24 1421)   PRN Meds:.acetaminophen  **OR** acetaminophen , benzonatate , hydrALAZINE , ipratropium, ondansetron  **OR** ondansetron  (ZOFRAN ) IV,  senna-docusate  Current Outpatient Medications  Medication Instructions   amLODipine  (NORVASC ) 5 mg, Oral, Daily   APPLE CIDER VINEGAR PO Take by mouth.   aspirin 325 mg, Daily at bedtime   dicyclomine  (BENTYL ) 10 mg, Oral, 2 times daily PRN   ezetimibe  (ZETIA ) 10 mg, Oral, Daily   Fish Oil 1,200 mg, 2 times daily   ibuprofen  (ADVIL ) 400-800 mg, Every 8 hours PRN    MAGNESIUM COMPLEX HIGH POTENCY PO 1 tablet, Daily   metoprolol  tartrate (LOPRESSOR ) 25 mg, Oral, 2 times daily   Multiple Vitamin (MULTIVITAMIN WITH MINERALS) TABS tablet 1 tablet, Daily   olopatadine  (PATANOL) 0.1 % ophthalmic solution 1 drop, Both Eyes, 2 times daily   pantoprazole  (PROTONIX ) 40 mg, Oral, 2 times daily   sertraline  (ZOLOFT ) 200 mg, Oral, Daily   tiZANidine  (ZANAFLEX ) 4 mg, Oral, Daily at bedtime   traZODone  (DESYREL ) 150 mg, Oral, Daily at bedtime   Turmeric 500 mg, 2 times daily    DVT prophylaxis: enoxaparin  (LOVENOX ) injection 40 mg Start: 05/02/24 2200 Place TED hose Start: 05/02/24 1234   Code Status:   Code Status: Full Code  Family Communication: None  Disposition Plan: Likely home pending clinical improvement PT -   -   OT -   -    Level of care: Telemetry  Consultants:  None  Procedures:  None  Antimicrobials: IV Rocephin , p.o. azithromycin   Subjective: Patient examined at bedside.  Reports persistent cough, worse with deep breaths.  Denies any abdominal pain, nausea, vomiting.  Denies any fevers or chills.  Objective: Vitals:   05/04/24 0733 05/04/24 1526 05/04/24 2021 05/05/24 0439  BP: (!) 153/83 130/72 131/68 134/73  Pulse: 97 91 90 84  Resp: 16 16 18 20   Temp: 99.1 F (37.3 C) 99.1 F (37.3 C) 98.8 F (37.1 C) 98.9 F (37.2 C)  TempSrc: Oral Oral Oral Oral  SpO2: 95% 93% 93% 93%  Weight:      Height:        Intake/Output Summary (Last 24 hours) at 05/05/2024 0659 Last data filed at 05/04/2024 1850 Gross per 24 hour  Intake 1070.57 ml  Output --  Net 1070.57 ml   Filed Weights   05/02/24 1009 05/02/24 1427  Weight: 68 kg 68 kg    Examination:  Gen: NAD, A&Ox3 HEENT: NCAT, EOMI Neck: Supple, no JVD CV: RRR, no murmurs Resp: normal WOB, coarse breath sounds in left lower lobe.  Nasal cannula in place Abd: Soft, NTND, no guarding Ext: No LE edema Skin: Warm, dry Neuro: No focal neurological deficits Psych: Calm,  cooperative, appropriate affect    Data Reviewed: I have personally reviewed following labs and imaging studies  CBC: Recent Labs  Lab 05/02/24 1015 05/03/24 0504 05/05/24 0552  WBC 8.9 14.7* 10.5  HGB 12.3 10.9* 10.4*  HCT 37.4 33.6* 32.0*  MCV 91.7 93.3 91.4  PLT 157 137* 168   Basic Metabolic Panel: Recent Labs  Lab 05/02/24 1015 05/03/24 0504 05/05/24 0552  NA 135 140 142  K 4.0 4.0 3.9  CL 100 103 106  CO2 22 25 29   GLUCOSE 137* 118* 96  BUN 18 20 14   CREATININE 1.26* 1.51* 1.09*  CALCIUM 8.9 8.2* 8.8*   GFR: Estimated Creatinine Clearance: 41.3 mL/min (A) (by C-G formula based on SCr of 1.09 mg/dL (H)). Liver Function Tests: Recent Labs  Lab 05/02/24 1015  AST 30  ALT 16  ALKPHOS 95  BILITOT 0.4  PROT 7.3  ALBUMIN 4.2  No results for input(s): LIPASE, AMYLASE in the last 168 hours. No results for input(s): AMMONIA in the last 168 hours. Coagulation Profile: No results for input(s): INR, PROTIME in the last 168 hours. Cardiac Enzymes: No results for input(s): CKTOTAL, CKMB, CKMBINDEX, TROPONINI in the last 168 hours. BNP (last 3 results) No results for input(s): PROBNP in the last 8760 hours. HbA1C: No results for input(s): HGBA1C in the last 72 hours. CBG: No results for input(s): GLUCAP in the last 168 hours. Lipid Profile: No results for input(s): CHOL, HDL, LDLCALC, TRIG, CHOLHDL, LDLDIRECT in the last 72 hours. Thyroid  Function Tests: No results for input(s): TSH, T4TOTAL, FREET4, T3FREE, THYROIDAB in the last 72 hours. Anemia Panel: No results for input(s): VITAMINB12, FOLATE, FERRITIN, TIBC, IRON, RETICCTPCT in the last 72 hours. Sepsis Labs: Recent Labs  Lab 05/02/24 1118  LATICACIDVEN 1.0    Recent Results (from the past 240 hours)  Resp panel by RT-PCR (RSV, Flu A&B, Covid) Anterior Nasal Swab     Status: None   Collection Time: 05/02/24 10:11 AM   Specimen: Anterior  Nasal Swab  Result Value Ref Range Status   SARS Coronavirus 2 by RT PCR NEGATIVE NEGATIVE Final    Comment: (NOTE) SARS-CoV-2 target nucleic acids are NOT DETECTED.  The SARS-CoV-2 RNA is generally detectable in upper respiratory specimens during the acute phase of infection. The lowest concentration of SARS-CoV-2 viral copies this assay can detect is 138 copies/mL. A negative result does not preclude SARS-Cov-2 infection and should not be used as the sole basis for treatment or other patient management decisions. A negative result may occur with  improper specimen collection/handling, submission of specimen other than nasopharyngeal swab, presence of viral mutation(s) within the areas targeted by this assay, and inadequate number of viral copies(<138 copies/mL). A negative result must be combined with clinical observations, patient history, and epidemiological information. The expected result is Negative.  Fact Sheet for Patients:  bloggercourse.com  Fact Sheet for Healthcare Providers:  seriousbroker.it  This test is no t yet approved or cleared by the [place]  FDA and  has been authorized for detection and/or diagnosis of SARS-CoV-2 by FDA under an Emergency Use Authorization (EUA). This EUA will remain  in effect (meaning this test can be used) for the duration of the COVID-19 declaration under Section 564(b)(1) of the Act, 21 U.S.C.section 360bbb-3(b)(1), unless the authorization is terminated  or revoked sooner.       Influenza A by PCR NEGATIVE NEGATIVE Final   Influenza B by PCR NEGATIVE NEGATIVE Final    Comment: (NOTE) The Xpert Xpress SARS-CoV-2/FLU/RSV plus assay is intended as an aid in the diagnosis of influenza from Nasopharyngeal swab specimens and should not be used as a sole basis for treatment. Nasal washings and aspirates are unacceptable for Xpert Xpress SARS-CoV-2/FLU/RSV testing.  Fact Sheet for  Patients: bloggercourse.com  Fact Sheet for Healthcare Providers: seriousbroker.it  This test is not yet approved or cleared by the [place]  FDA and has been authorized for detection and/or diagnosis of SARS-CoV-2 by FDA under an Emergency Use Authorization (EUA). This EUA will remain in effect (meaning this test can be used) for the duration of the COVID-19 declaration under Section 564(b)(1) of the Act, 21 U.S.C. section 360bbb-3(b)(1), unless the authorization is terminated or revoked.     Resp Syncytial Virus by PCR NEGATIVE NEGATIVE Final    Comment: (NOTE) Fact Sheet for Patients: bloggercourse.com  Fact Sheet for Healthcare Providers: seriousbroker.it  This test is not yet  approved or cleared by the [place]  FDA and has been authorized for detection and/or diagnosis of SARS-CoV-2 by FDA under an Emergency Use Authorization (EUA). This EUA will remain in effect (meaning this test can be used) for the duration of the COVID-19 declaration under Section 564(b)(1) of the Act, 21 U.S.C. section 360bbb-3(b)(1), unless the authorization is terminated or revoked.  Performed at [facility], [address] [place], [place] [address]   Blood culture (routine x 2)     Status: None (Preliminary result)   Collection Time: 05/02/24 11:18 AM   Specimen: BLOOD  Result Value Ref Range Status   Specimen Description BLOOD BLOOD RIGHT FOREARM  Final   Special Requests   Final    BOTTLES DRAWN AEROBIC AND ANAEROBIC Blood Culture results may not be optimal due to an inadequate volume of blood received in culture bottles   Culture   Final    NO GROWTH 3 DAYS Performed at [facility], [address] [place], [place] [address]    Report Status PENDING  Incomplete  Blood culture (routine x 2)     Status: None (Preliminary result)   Collection Time: 05/02/24  11:23 AM   Specimen: BLOOD  Result Value Ref Range Status   Specimen Description BLOOD BLOOD LEFT FOREARM  Final   Special Requests   Final    BOTTLES DRAWN AEROBIC AND ANAEROBIC Blood Culture results may not be optimal due to an inadequate volume of blood received in culture bottles   Culture   Final    NO GROWTH 3 DAYS Performed at [facility], [address] [place], [place] [address]    Report Status PENDING  Incomplete     Radiology Studies: No results found.  Scheduled Meds:  amLODipine   5 mg Oral Daily   azithromycin   500 mg Oral Daily   dextromethorphan -guaiFENesin   1 tablet Oral BID   enoxaparin  (LOVENOX ) injection  40 mg Subcutaneous Q24H   ezetimibe   10 mg Oral QHS   lidocaine   1 patch Transdermal Q24H   metoprolol  tartrate  25 mg Oral BID   pantoprazole   40 mg Oral BID AC   sertraline   200 mg Oral Daily   traZODone   150 mg Oral QHS   Continuous Infusions:  cefTRIAXone  (ROCEPHIN )  IV Stopped (05/04/24 1421)     Unresulted Labs (From admission, onward)     Start     Ordered   05/02/24 1011  Urinalysis, Routine w reflex microscopic -Urine, Clean Catch  Once,   URGENT       Question Answer Comment  Obtain urine by in and out catheter if not obtained within 30 minutes of placing in a treatment room? Yes   Specimen Source Urine, Clean Catch      05/02/24 1011             LOS:  LOS: 3 days   Time Spent: 40 minutes  Molly Maselli Al-Sultani, MD [facility]  If 7PM-7AM, please contact night-coverage  05/05/2024, 6:59 AM      "

## 2024-05-05 NOTE — Plan of Care (Signed)
  Problem: Fluid Volume: Goal: Hemodynamic stability will improve Outcome: Progressing   Problem: Clinical Measurements: Goal: Diagnostic test results will improve Outcome: Progressing   Problem: Respiratory: Goal: Ability to maintain adequate ventilation will improve Outcome: Progressing   Problem: Education: Goal: Knowledge of General Education information will improve Description: Including pain rating scale, medication(s)/side effects and non-pharmacologic comfort measures Outcome: Progressing   Problem: Health Behavior/Discharge Planning: Goal: Ability to manage health-related needs will improve Outcome: Progressing   Problem: Clinical Measurements: Goal: Ability to maintain clinical measurements within normal limits will improve Outcome: Progressing   Problem: Activity: Goal: Risk for activity intolerance will decrease Outcome: Progressing   Problem: Nutrition: Goal: Adequate nutrition will be maintained Outcome: Progressing   Problem: Coping: Goal: Level of anxiety will decrease Outcome: Progressing   Problem: Elimination: Goal: Will not experience complications related to bowel motility Outcome: Progressing   Problem: Pain Managment: Goal: General experience of comfort will improve and/or be controlled Outcome: Progressing   Problem: Safety: Goal: Ability to remain free from injury will improve Outcome: Progressing   Problem: Skin Integrity: Goal: Risk for impaired skin integrity will decrease Outcome: Progressing

## 2024-05-05 NOTE — Plan of Care (Signed)
" °  Problem: Fluid Volume: Goal: Hemodynamic stability will improve Outcome: Progressing   Problem: Respiratory: Goal: Ability to maintain adequate ventilation will improve Outcome: Progressing   Problem: Education: Goal: Knowledge of General Education information will improve Description: Including pain rating scale, medication(s)/side effects and non-pharmacologic comfort measures Outcome: Progressing   Problem: Clinical Measurements: Goal: Respiratory complications will improve Outcome: Progressing   Problem: Coping: Goal: Level of anxiety will decrease Outcome: Progressing   "

## 2024-05-06 NOTE — Plan of Care (Signed)
   Problem: Nutrition: Goal: Adequate nutrition will be maintained Outcome: Progressing   Problem: Pain Managment: Goal: General experience of comfort will improve and/or be controlled Outcome: Progressing   Problem: Safety: Goal: Ability to remain free from injury will improve Outcome: Progressing

## 2024-05-06 NOTE — Progress Notes (Signed)
 " PROGRESS NOTE    ADELIS DOCTER  FMW:[id number] DOB: 12-23-1950 DOA: 05/02/2024 PCP: Vincente Shivers, NP  111A/111A-AA  LOS: 4 days   Brief hospital course:   Assessment & Plan: [name] is a 73 year old female with history of CKD 3B, chronic insomnia, hypertension, hyperlipidemia, depression with anxiety, chronic muscle aches.   05/02/2024: She presents to the ED for chief concerns of shortness of breath for several days.   Vitals in the ED showed T of 102.6  # severe sepsis - Met SIRS criteria with fever to 102.6 F, later febrile to 100.5 F, tachypnea, and leukocytosis 14.7.  AKI. - Lactic acid within normal limits - Likely secondary to community-acquired pneumonia - Blood cultures no growth to date - Management of community-acquired pneumonia as below   #Acute hypoxic respiratory failure - Patient noted to be hypoxic to 81% on RA on 12/24, while receiving LR at 150 mL an hour.  SpO2 improved to 92% on 3.5 L O2.  IVF's were discontinued and she received IV Lasix  20 mg x 1 - CXR 12/27 showed increasing opacity at the left lung base with likely airspace component pleural effusion --on room air today   # CAP - Reportedly began to have symptoms of productive cough on Saturday, which she felt improved, but then worsened on Tuesday with development of constant shortness of breath, chills, with continued productive cough - CXR showed mild anterior left lung atelectasis and/or infiltrate - 4 Plex PCR negative - Afebrile, leukocytosis resolved --cont ceftriaxone  and azithro   #AKI on CKD stage IIIa - resolved - Follows in consistently with outpatient nephrology - Creatinine elevated to 1.51 on 05/03/2024, up from baseline of around 1.15 - Resolved with creatinine at 1.09 - Recommend continued follow-up with nephrology in the outpatient setting   #Nausea/vomiting - resolved - Presented with few episodes of nausea and vomiting which have resolved at this time    #Hypertension --cont amlodipine  and Lopressor    #Mixed hyperlipidemia - Continue home ezetimibe    #GERD - Continue PPI   #Anxiety depression - Continue home Zoloft  200 mg daily   #Insomnia - Continue home trazodone  150 mg nightly    DVT prophylaxis: Lovenox  SQ Code Status: Full code  Family Communication:  Level of care: Telemetry Dispo:   The patient is from: home Anticipated d/c is to: home Anticipated d/c date is: tomorrow   Subjective and Interval History:  Pt reported not feeling well, but didn't elaborate.   Objective: Vitals:   05/06/24 0832 05/06/24 1238 05/06/24 1700 05/06/24 2008  BP: (!) 154/82  (!) 143/73 (!) 152/65  Pulse: 67  68 72  Resp: 16  16 16   Temp: 98.9 F (37.2 C)  99.5 F (37.5 C) 98.8 F (37.1 C)  TempSrc: Oral  Oral   SpO2: 95% 92% 93% 92%  Weight:      Height:        Intake/Output Summary (Last 24 hours) at 05/06/2024 2247 Last data filed at 05/06/2024 1700 Gross per 24 hour  Intake 340 ml  Output --  Net 340 ml   Filed Weights   05/02/24 1009 05/02/24 1427  Weight: 68 kg 68 kg    Examination:   Constitutional: NAD, AAOx3 HEENT: conjunctivae and lids normal, EOMI CV: No cyanosis.   RESP: normal respiratory effort, on RA Neuro: II - XII grossly intact.   Psych: Normal mood and affect.  Appropriate judgement and reason   Data Reviewed: I have personally reviewed labs and imaging  studies  Time spent: 50 minutes  Ellouise Haber, MD [facility] If 7PM-7AM, please contact night-coverage 05/06/2024, 10:47 PM   "

## 2024-05-07 ENCOUNTER — Other Ambulatory Visit: Payer: Self-pay

## 2024-05-07 LAB — CULTURE, BLOOD (ROUTINE X 2)
Culture: NO GROWTH
Culture: NO GROWTH

## 2024-05-07 MED ORDER — BENZONATATE 100 MG PO CAPS
100.0000 mg | ORAL_CAPSULE | Freq: Three times a day (TID) | ORAL | 0 refills | Status: AC | PRN
  Filled 2024-05-07: qty 20, 7d supply, fill #0

## 2024-05-07 NOTE — Discharge Summary (Signed)
 "  Physician Discharge Summary   [name]  female DOB: 12/05/1950  FMW:[id number]  PCP: Vincente Shivers, NP  Admit date: 05/02/2024 Discharge date: 05/07/2024  Admitted From: homea Disposition:  home CODE STATUS: Full code   Hospital Course:  For full details, please see H&P, progress notes, consult notes and ancillary notes.  Briefly,  [name] is a 73 year old female with history of CKD 3B, hypertension, depression with anxiety, chronic muscle aches.   05/02/2024: She presents to the ED for chief concerns of shortness of breath for several days.   Vitals in the ED showed T of 102.6   # severe sepsis - Met SIRS criteria with fever to 102.6 F, tachypnea, and leukocytosis 14.7.  AKI. - Lactic acid within normal limits - Likely secondary to community-acquired pneumonia - Blood cultures no growth to date - Management of community-acquired pneumonia as below   #Acute hypoxic respiratory failure - Patient noted to be hypoxic to 81% on RA on 12/24, while receiving LR at 150 mL an hour.  SpO2 improved to 92% on 3.5 L O2.  IVF's were discontinued and she received IV Lasix  20 mg x 1 - CXR 12/27 showed increasing opacity at the left lung base with likely airspace component pleural effusion --on room air prior to discharge.   # CAP --constant shortness of breath, chills, with continued productive cough - CXR showed mild anterior left lung atelectasis and/or infiltrate - 4 Plex PCR negative --completed 5 days of ceftriaxone  and azithro   #AKI on CKD stage IIIa - resolved - Creatinine elevated to 1.51 on 05/03/2024, up from baseline of around 1.15 - Resolved with creatinine at 1.09  #Nausea/vomiting - resolved - Presented with few episodes of nausea and vomiting which have resolved at this time   #Hypertension --cont amlodipine  and Lopressor    #Mixed hyperlipidemia - Continue home ezetimibe    #GERD - Continue PPI   #Anxiety depression - Continue home Zoloft  200 mg  daily   #Insomnia - Continue home trazodone  150 mg nightly   Discharge Diagnoses:  Principal Problem:   Community acquired pneumonia Active Problems:   Hypertension   Fever   Chronic insomnia   Anxiety and depression   Gastroesophageal reflux disease   Stage 3b chronic kidney disease (HCC)   Statin intolerance   Mixed hyperlipidemia   Sepsis (HCC)   30 Day Unplanned Readmission Risk Score    Flowsheet Row ED to Hosp-Admission (Current) from 05/02/2024 in [facility] 1C MEDICAL TELEMETRY  30 Day Unplanned Readmission Risk Score (%) 10.7 Filed at 05/07/2024 0801    This score is the patient's risk of an unplanned readmission within 30 days of being discharged (0 -100%). The score is based on dignosis, age, lab data, medications, orders, and past utilization.   Low:  0-14.9   Medium: 15-21.9   High: 22-29.9   Extreme: 30 and above         Discharge Instructions:  Allergies as of 05/07/2024       Reactions   Erythromycin Other (See Comments)   Severe abdominal pain.   Valium [diazepam] Other (See Comments)   Mood disturbances/combative behaviors.   Venlafaxine Rash        Medication List     TAKE these medications    amLODipine  5 MG tablet Commonly known as: NORVASC  Take 1 tablet (5 mg total) by mouth daily.   APPLE CIDER VINEGAR PO Take by mouth.   aspirin 325 MG tablet Take 325 mg  by mouth at bedtime.   benzonatate  100 MG capsule Commonly known as: TESSALON  Take 1 capsule (100 mg total) by mouth 3 (three) times daily as needed for cough.   dicyclomine  10 MG capsule Commonly known as: BENTYL  Take 1 capsule (10 mg total) by mouth 2 (two) times daily as needed for spasms.   ezetimibe  10 MG tablet Commonly known as: ZETIA  TAKE 1 TABLET BY MOUTH EVERY DAY   Fish Oil 1200 MG Caps Take 1,200 mg by mouth 2 (two) times daily.   ibuprofen  200 MG tablet Commonly known as: ADVIL  Take 400-800 mg by mouth every 8 (eight) hours as  needed (for pain/headaches.).   MAGNESIUM COMPLEX HIGH POTENCY PO Take 1 tablet by mouth daily.   metoprolol  tartrate 25 MG tablet Commonly known as: LOPRESSOR  Take 1 tablet (25 mg total) by mouth 2 (two) times daily.   multivitamin with minerals Tabs tablet Take 1 tablet by mouth daily. Centrum Silver   olopatadine  0.1 % ophthalmic solution Commonly known as: PATANOL Place 1 drop into both eyes 2 (two) times daily.   pantoprazole  40 MG tablet Commonly known as: PROTONIX  TAKE 1 TABLET BY MOUTH TWICE A DAY   sertraline  100 MG tablet Commonly known as: ZOLOFT  Take 2 tablets (200 mg total) by mouth daily.   tiZANidine  4 MG tablet Commonly known as: ZANAFLEX  TAKE 1 TABLET BY MOUTH AT BEDTIME.   traZODone  150 MG tablet Commonly known as: DESYREL  Take 1 tablet (150 mg total) by mouth at bedtime.   Turmeric 500 MG Caps Take 500 mg by mouth 2 (two) times daily.         Follow-up Information     Vincente Shivers, NP Follow up.   Specialty: General Practice Why: Hospital follow up Contact information: [address] [place] [address] [phone]         Henriette Clotilda CROME, MD. Schedule an appointment as soon as possible for a visit in 4 week(s).   Specialty: Nephrology Contact information: [address] [place] [address] [phone]                 Allergies[1]   The results of significant diagnostics from this hospitalization (including imaging, microbiology, ancillary and laboratory) are listed below for reference.   Consultations:   Procedures/Studies: DG Chest Port 1 View Result Date: 05/05/2024 CLINICAL DATA:  Hypoxia. EXAM: PORTABLE CHEST 1 VIEW COMPARISON:  05/02/2024 FINDINGS: Lower lung volumes from prior exam. Stable heart size and mediastinal contours. Increasing opacity at the left lung base with likely airspace component and pleural effusion. No pneumothorax. No pulmonary edema. IMPRESSION: Increasing opacity at the  left lung base with likely airspace component and pleural effusion. Electronically Signed   By: Andrea Gasman M.D.   On: 05/05/2024 12:28   DG Chest 2 View Result Date: 05/02/2024 CLINICAL DATA:  Fever. EXAM: CHEST - 2 VIEW COMPARISON:  None Available. FINDINGS: The heart size and mediastinal contours are within normal limits. Mild atelectasis and/or infiltrate is seen within the anterior aspect of the mid to lower left lung. No pleural effusion or pneumothorax is identified. Multilevel degenerative changes are present throughout the thoracic spine. IMPRESSION: Mild anterior left lung atelectasis and/or infiltrate. Electronically Signed   By: Suzen Dials M.D.   On: 05/02/2024 11:29      Labs: BNP (last 3 results) No results for input(s): BNP in the last 8760 hours. Basic Metabolic Panel: Recent Labs  Lab 05/02/24 1015 05/03/24 0504 05/05/24 9447  NA 135 140 142  K 4.0 4.0 3.9  CL 100 103 106  CO2 22 25 29   GLUCOSE 137* 118* 96  BUN 18 20 14   CREATININE 1.26* 1.51* 1.09*  CALCIUM 8.9 8.2* 8.8*   Liver Function Tests: Recent Labs  Lab 05/02/24 1015  AST 30  ALT 16  ALKPHOS 95  BILITOT 0.4  PROT 7.3  ALBUMIN 4.2   No results for input(s): LIPASE, AMYLASE in the last 168 hours. No results for input(s): AMMONIA in the last 168 hours. CBC: Recent Labs  Lab 05/02/24 1015 05/03/24 0504 05/05/24 0552  WBC 8.9 14.7* 10.5  HGB 12.3 10.9* 10.4*  HCT 37.4 33.6* 32.0*  MCV 91.7 93.3 91.4  PLT 157 137* 168   Cardiac Enzymes: No results for input(s): CKTOTAL, CKMB, CKMBINDEX, TROPONINI in the last 168 hours. BNP: Invalid input(s): POCBNP CBG: No results for input(s): GLUCAP in the last 168 hours. D-Dimer No results for input(s): DDIMER in the last 72 hours. Hgb A1c No results for input(s): HGBA1C in the last 72 hours. Lipid Profile No results for input(s): CHOL, HDL, LDLCALC, TRIG, CHOLHDL, LDLDIRECT in the last 72  hours. Thyroid  function studies No results for input(s): TSH, T4TOTAL, T3FREE, THYROIDAB in the last 72 hours.  Invalid input(s): FREET3 Anemia work up No results for input(s): VITAMINB12, FOLATE, FERRITIN, TIBC, IRON, RETICCTPCT in the last 72 hours. Urinalysis    Component Value Date/Time   COLORURINE YELLOW 01/08/2011 0534   APPEARANCEUR CLEAR 01/08/2011 0534   LABSPEC 1.015 10/09/2017 1213   PHURINE 5.5 10/09/2017 1213   GLUCOSEU NEGATIVE 10/09/2017 1213   HGBUR MODERATE (A) 10/09/2017 1213   BILIRUBINUR NEGATIVE 10/09/2017 1213   KETONESUR NEGATIVE 10/09/2017 1213   PROTEINUR NEGATIVE 10/09/2017 1213   UROBILINOGEN 0.2 10/09/2017 1213   NITRITE NEGATIVE 10/09/2017 1213   LEUKOCYTESUR LARGE (A) 10/09/2017 1213   Sepsis Labs Recent Labs  Lab 05/02/24 1015 05/03/24 0504 05/05/24 0552  WBC 8.9 14.7* 10.5   Microbiology Recent Results (from the past 240 hours)  Resp panel by RT-PCR (RSV, Flu A&B, Covid) Anterior Nasal Swab     Status: None   Collection Time: 05/02/24 10:11 AM   Specimen: Anterior Nasal Swab  Result Value Ref Range Status   SARS Coronavirus 2 by RT PCR NEGATIVE NEGATIVE Final    Comment: (NOTE) SARS-CoV-2 target nucleic acids are NOT DETECTED.  The SARS-CoV-2 RNA is generally detectable in upper respiratory specimens during the acute phase of infection. The lowest concentration of SARS-CoV-2 viral copies this assay can detect is 138 copies/mL. A negative result does not preclude SARS-Cov-2 infection and should not be used as the sole basis for treatment or other patient management decisions. A negative result may occur with  improper specimen collection/handling, submission of specimen other than nasopharyngeal swab, presence of viral mutation(s) within the areas targeted by this assay, and inadequate number of viral copies(<138 copies/mL). A negative result must be combined with clinical observations, patient history, and  epidemiological information. The expected result is Negative.  Fact Sheet for Patients:  bloggercourse.com  Fact Sheet for Healthcare Providers:  seriousbroker.it  This test is no t yet approved or cleared by the [place]  FDA and  has been authorized for detection and/or diagnosis of SARS-CoV-2 by FDA under an Emergency Use Authorization (EUA). This EUA will remain  in effect (meaning this test can be used) for the duration of the COVID-19 declaration under Section 564(b)(1) of the Act, 21 U.S.C.section 360bbb-3(b)(1), unless the authorization is  terminated  or revoked sooner.       Influenza A by PCR NEGATIVE NEGATIVE Final   Influenza B by PCR NEGATIVE NEGATIVE Final    Comment: (NOTE) The Xpert Xpress SARS-CoV-2/FLU/RSV plus assay is intended as an aid in the diagnosis of influenza from Nasopharyngeal swab specimens and should not be used as a sole basis for treatment. Nasal washings and aspirates are unacceptable for Xpert Xpress SARS-CoV-2/FLU/RSV testing.  Fact Sheet for Patients: bloggercourse.com  Fact Sheet for Healthcare Providers: seriousbroker.it  This test is not yet approved or cleared by the [place]  FDA and has been authorized for detection and/or diagnosis of SARS-CoV-2 by FDA under an Emergency Use Authorization (EUA). This EUA will remain in effect (meaning this test can be used) for the duration of the COVID-19 declaration under Section 564(b)(1) of the Act, 21 U.S.C. section 360bbb-3(b)(1), unless the authorization is terminated or revoked.     Resp Syncytial Virus by PCR NEGATIVE NEGATIVE Final    Comment: (NOTE) Fact Sheet for Patients: bloggercourse.com  Fact Sheet for Healthcare Providers: seriousbroker.it  This test is not yet approved or cleared by the [place]  FDA and has been  authorized for detection and/or diagnosis of SARS-CoV-2 by FDA under an Emergency Use Authorization (EUA). This EUA will remain in effect (meaning this test can be used) for the duration of the COVID-19 declaration under Section 564(b)(1) of the Act, 21 U.S.C. section 360bbb-3(b)(1), unless the authorization is terminated or revoked.  Performed at [facility], [address] [place], [place] [address]   Blood culture (routine x 2)     Status: None (Preliminary result)   Collection Time: 05/02/24 11:18 AM   Specimen: BLOOD  Result Value Ref Range Status   Specimen Description BLOOD BLOOD RIGHT FOREARM  Final   Special Requests   Final    BOTTLES DRAWN AEROBIC AND ANAEROBIC Blood Culture results may not be optimal due to an inadequate volume of blood received in culture bottles   Culture   Final    NO GROWTH 4 DAYS Performed at [facility], [address], [place], [place] [address]    Report Status PENDING  Incomplete  Blood culture (routine x 2)     Status: None (Preliminary result)   Collection Time: 05/02/24 11:23 AM   Specimen: BLOOD  Result Value Ref Range Status   Specimen Description BLOOD BLOOD LEFT FOREARM  Final   Special Requests   Final    BOTTLES DRAWN AEROBIC AND ANAEROBIC Blood Culture results may not be optimal due to an inadequate volume of blood received in culture bottles   Culture   Final    NO GROWTH 4 DAYS Performed at [facility], [address], [place], [place] [address]    Report Status PENDING  Incomplete     Total time spend on discharging this patient, including the last patient exam, discussing the hospital stay, instructions for ongoing care as it relates to all pertinent caregivers, as well as preparing the medical discharge records, prescriptions, and/or referrals as applicable, is 30 minutes.    Ellouise Haber, MD  [facility] 05/07/2024, 8:50 AM       [1]  Allergies Allergen Reactions    Erythromycin Other (See Comments)    Severe abdominal pain.   Valium [Diazepam] Other (See Comments)    Mood disturbances/combative behaviors.   Venlafaxine Rash   "

## 2024-05-08 ENCOUNTER — Telehealth: Payer: Self-pay | Admitting: *Deleted

## 2024-05-08 NOTE — Transitions of Care (Post Inpatient/ED Visit) (Signed)
" ° °  05/08/2024  Name: FRED HAMMES MRN: [id number] DOB: 1950/10/09  Today's TOC FU Call Status: Today's TOC FU Call Status:: Unsuccessful Call (1st Attempt) Unsuccessful Call (1st Attempt) Date: 05/08/24  Attempted to reach the patient regarding the most recent Inpatient/ED visit.  Follow Up Plan: Additional outreach attempts will be made to reach the patient to complete the Transitions of Care (Post Inpatient/ED visit) call.   Cathlean Headland BSN RN [facility] Management Coordinator [email or web address] Direct Dial: [phone]  Fax: [phone] Website: [email or web address]  "

## 2024-05-09 ENCOUNTER — Telehealth: Payer: Self-pay

## 2024-05-09 NOTE — Transitions of Care (Post Inpatient/ED Visit) (Signed)
" ° °  05/09/2024  Name: [name] MRN: [id number] DOB: 11/18/1950  Today's TOC FU Call Status: Today's TOC FU Call Status:: Unsuccessful Call (2nd Attempt) Unsuccessful Call (2nd Attempt) Date: 05/09/24  Attempted to reach the patient regarding the most recent Inpatient/ED visit.  Follow Up Plan: Additional outreach attempts will be made to reach the patient to complete the Transitions of Care (Post Inpatient/ED visit) call.   Arvin Seip RN, BSN, CCM [employer], Population Health Case Manager Phone: [phone]  "

## 2024-06-06 ENCOUNTER — Other Ambulatory Visit: Payer: Self-pay | Admitting: General Practice

## 2024-06-06 DIAGNOSIS — R109 Unspecified abdominal pain: Secondary | ICD-10-CM

## 2024-06-10 ENCOUNTER — Other Ambulatory Visit: Payer: Self-pay | Admitting: General Practice

## 2024-06-10 DIAGNOSIS — F5104 Psychophysiologic insomnia: Secondary | ICD-10-CM

## 2024-06-14 ENCOUNTER — Other Ambulatory Visit: Payer: Self-pay | Admitting: General Practice

## 2024-06-14 DIAGNOSIS — G8929 Other chronic pain: Secondary | ICD-10-CM

## 2024-06-14 DIAGNOSIS — K219 Gastro-esophageal reflux disease without esophagitis: Secondary | ICD-10-CM

## 2024-07-05 ENCOUNTER — Ambulatory Visit: Admitting: General Practice
